# Patient Record
Sex: Male | Born: 1995 | Race: Black or African American | Hispanic: No | Marital: Single | Smoking: Former smoker
Health system: Southern US, Community
[De-identification: ages and names within clinical notes are randomized; demographics above are authoritative.]

## PROBLEM LIST (undated history)

## (undated) DIAGNOSIS — M2290 Unspecified disorder of patella, unspecified knee: Secondary | ICD-10-CM

## (undated) DIAGNOSIS — L309 Dermatitis, unspecified: Secondary | ICD-10-CM

## (undated) DIAGNOSIS — F429 Obsessive-compulsive disorder, unspecified: Secondary | ICD-10-CM

## (undated) HISTORY — PX: WISDOM TOOTH EXTRACTION: SHX21

---
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
## 2014-08-04 ENCOUNTER — Ambulatory Visit (INDEPENDENT_AMBULATORY_CARE_PROVIDER_SITE_OTHER): Payer: Medicaid Other | Admitting: Family Medicine

## 2014-08-04 ENCOUNTER — Encounter: Payer: Self-pay | Admitting: Family Medicine

## 2014-08-04 VITALS — BP 140/88 | HR 90 | Ht 77.0 in | Wt 263.0 lb

## 2014-08-04 DIAGNOSIS — Z0000 Encounter for general adult medical examination without abnormal findings: Secondary | ICD-10-CM

## 2014-08-04 DIAGNOSIS — Z209 Contact with and (suspected) exposure to unspecified communicable disease: Secondary | ICD-10-CM

## 2014-08-04 LAB — LIPID PANEL
Cholesterol: 131 mg/dL (ref 0–200)
HDL: 58 mg/dL (ref >39)
LDL Cholesterol: 65 mg/dL (ref 0–99)
Total CHOL/HDL Ratio: 2.3 Ratio
Triglycerides: 38 mg/dL (ref <150)
VLDL: 8 mg/dL (ref 0–40)

## 2014-08-04 NOTE — Progress Notes (Signed)
   Subjective:    Patient ID: Tanner Figueroa, male    DOB: [birth year], 19 y.o.   MRN: [id number]  HPI He is here for complete examination. He apparently is still in foster care. He [place] [employer]. He is a Medical laboratory scientific officersophomore. He does have concern over deformed right great toe that occurred after a toe fracture. Why she has no particular concerns or complaints. He is sexually active with women and does not always use condoms. He has had a recent test for GC/chlamydia. He does not know his whole family history especially his father. Mother apparently has mental illnesses. Social history was reviewed. He does wear his seatbelt. Does exercise.   Review of Systems  All other systems reviewed and are negative.      Objective:   Physical Exam BP 140/88 mmHg  Pulse 90  Ht 6\' 5"  (1.956 m)  Wt 263 lb (119.296 kg)  BMI 31.18 kg/m2  General Appearance:    Alert, cooperative, no distress, appears stated age  Head:    Normocephalic, without obvious abnormality, atraumatic  Eyes:    PERRL, conjunctiva/corneas clear, EOM's intact, fundi    benign  Ears:    Normal TM's and external ear canals  Nose:   Nares normal, mucosa normal, no drainage or sinus   tenderness  Throat:   Lips, mucosa, and tongue normal; teeth and gums normal  Neck:   Supple, no lymphadenopathy;  thyroid:  no   enlargement/tenderness/nodules; no carotid   bruit or JVD  Back:    Spine nontender, no curvature, ROM normal, no CVA     tenderness  Lungs:     Clear to auscultation bilaterally without wheezes, rales or     ronchi; respirations unlabored  Chest Wall:    No tenderness or deformity   Heart:    Regular rate and rhythm, S1 and S2 normal, no murmur, rub   or gallop  Breast Exam:    No chest wall tenderness, masses or gynecomastia  Abdomen:     Soft, non-tender, nondistended, normoactive bowel sounds,    no masses, no hepatosplenomegaly  Genitalia:    Normal male external genitalia without lesions.   Testicles without masses.  No inguinal hernias.  Rectal:   Deferred due to age <40 and lack of symptoms  Extremities:   No clubbing, cyanosis or edema  Pulses:   2+ and symmetric all extremities  Skin:   Skin color, texture, turgor normal, no rashes or lesions  Lymph nodes:   Cervical, supraclavicular, and axillary nodes normal  Neurologic:   CNII-XII intact, normal strength, sensation and gait; reflexes 2+ and symmetric throughout          Psych:   Normal mood, affect, hygiene and grooming.          Assessment & Plan:  Routine general medical examination at a health care facility - Plan: Lipid panel  Contact with or exposure to communicable disease - Plan: HIV antibody, RPR  cautioned him to use condoms with sexual activity. Encouraged him to continue to wear his seatbelt. Discussed weight and weight management with him. He is right on the border with his BMI. Encouraged him to keep his waist size at 38 or lower.

## 2014-08-05 LAB — RPR

## 2014-08-05 LAB — HIV ANTIBODY (ROUTINE TESTING W REFLEX): HIV 1&2 Ab, 4th Generation: NONREACTIVE

## 2014-08-08 ENCOUNTER — Encounter: Payer: Self-pay | Admitting: Internal Medicine

## 2015-03-20 ENCOUNTER — Telehealth: Payer: Self-pay | Admitting: Family Medicine

## 2015-03-20 NOTE — Telephone Encounter (Signed)
Done pt has an appt this am with ENT

## 2015-03-20 NOTE — Telephone Encounter (Signed)
Kathy has taken care of this. 
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
## 2017-08-14 ENCOUNTER — Ambulatory Visit: Payer: Self-pay | Admitting: Family Medicine

## 2018-06-17 ENCOUNTER — Encounter (HOSPITAL_COMMUNITY): Payer: Self-pay

## 2018-06-17 ENCOUNTER — Emergency Department (HOSPITAL_COMMUNITY)
Admission: EM | Admit: 2018-06-17 | Discharge: 2018-06-17 | Disposition: A | Discharge status: Home / Self Care | Payer: Self-pay | Attending: Emergency Medicine | Admitting: Emergency Medicine

## 2018-06-17 DIAGNOSIS — Z113 Encounter for screening for infections with a predominantly sexual mode of transmission: Secondary | ICD-10-CM

## 2018-06-17 DIAGNOSIS — Z87891 Personal history of nicotine dependence: Secondary | ICD-10-CM | POA: Insufficient documentation

## 2018-06-17 DIAGNOSIS — B009 Herpesviral infection, unspecified: Secondary | ICD-10-CM | POA: Insufficient documentation

## 2018-06-17 LAB — URINALYSIS, ROUTINE W REFLEX MICROSCOPIC
Bilirubin Urine: NEGATIVE
GLUCOSE, UA: NEGATIVE mg/dL
HGB URINE DIPSTICK: NEGATIVE
KETONES UR: NEGATIVE mg/dL
LEUKOCYTES UA: NEGATIVE
Nitrite: NEGATIVE
Protein, ur: NEGATIVE mg/dL
Specific Gravity, Urine: 1.012 (ref 1.005–1.030)
pH: 7 (ref 5.0–8.0)

## 2018-06-17 MED ORDER — LIDOCAINE HCL 1 % IJ SOLN
INTRAMUSCULAR | Status: AC
  Filled 2018-06-17: qty 20

## 2018-06-17 MED ORDER — CEFTRIAXONE SODIUM 250 MG IJ SOLR
250.0000 mg | Freq: Once | INTRAMUSCULAR | Status: AC
  Administered 2018-06-17: 250 mg via INTRAMUSCULAR
  Filled 2018-06-17: qty 250

## 2018-06-17 MED ORDER — VALACYCLOVIR HCL 1 G PO TABS: 1000.0000 mg | ORAL_TABLET | Freq: Two times a day (BID) | ORAL | 0 refills | Status: AC

## 2018-06-17 MED ORDER — AZITHROMYCIN 250 MG PO TABS
1000.0000 mg | ORAL_TABLET | Freq: Once | ORAL | Status: AC
  Administered 2018-06-17: 1000 mg via ORAL
  Filled 2018-06-17: qty 4

## 2018-06-17 NOTE — Discharge Instructions (Addendum)
Evaluated today for STDs.  Your exam is consistent with herpes.  I have prescribed you Valtrex.  Please take as prescribed.  Also prophylactically treated you with Rocephin and azithromycin for gonorrhea and chlamydia.  If your results are positive he will be notified in 48 hours.  You will need to let your partners know at that time to they may be treated as well.  Urinalysis is negative for infection.  Please use condoms for protection while you have a herpes outbreak.  Return to the ED for any new worsening symptoms.

## 2018-06-17 NOTE — ED Notes (Signed)
Pt aware that urine specimen is needed at this time. Pt given water but denies urge to void.

## 2018-06-17 NOTE — ED Provider Notes (Signed)
[facility]-EMERGENCY DEPT Provider Note   CSN: [id number] Arrival date & time: 06/17/18  1604     History   Chief Complaint Chief Complaint  Patient presents with  . std check    HPI Tanner Figueroa is a 22 y.o. male with no significant past medical history who presents for evaluation of STD check.  Patient states he noticed 3 erythematous painful lesions to the shaft of his penis approximately 3 days ago.  Patient states he was seen by [facility], however patient states that they did not do an exam, they just did a swab for gonorrhea, chlamydia, HIV and syphilis.  States they did not offer prophylactic treatment at that time.  She states he is very concerned that he has an STD.  States partner told him that he tested positive for gonorrhea.  Patient would like to be treated with prophylactic treatment during his visit today.  Patient admits to dysuria x1 day.  Denies hematuria, fever, chills, nausea, vomiting, abdominal pain, testicular pain, penile discharge, pain with bowel movements.  Denies history of previous STDs.  Patient states he does not use condoms for protection.  States he does have pain to the lesions on the shaft.  Rates his pain a 5/10.  Denies radiation of pain.  Denies aggravating or alleviating factors.  History obtained from patient.  No interpreter was used.  HPI  Past Medical History:  Diagnosis Date  . Eczema   . OCD (obsessive compulsive disorder)     There are no active problems to display for this patient.   Past Surgical History:  Procedure Laterality Date  . WISDOM TOOTH EXTRACTION          Home Medications    Prior to Admission medications   Medication Sig Start Date End Date Taking? Authorizing Provider  valACYclovir (VALTREX) 1000 MG tablet Take 1 tablet (1,000 mg total) by mouth 2 (two) times daily for 10 days. 06/17/18 06/27/18  Arneda Sappington A, PA-C    Family History Family History  Problem Relation Age  of Onset  . Mental illness Mother     Social History Social History   Tobacco Use  . Smoking status: Former Smoker    Types: Cigarettes    Last attempt to quit: 07/04/2014    Years since quitting: 3.9  Substance Use Topics  . Alcohol use: No    Alcohol/week: 0.0 standard drinks  . Drug use: Yes    Types: Marijuana     Allergies   Penicillins   Review of Systems Review of Systems  Constitutional: Negative.   Gastrointestinal: Negative.   Genitourinary: Positive for dysuria and genital sores. Negative for decreased urine volume, difficulty urinating, discharge, enuresis, flank pain, frequency, hematuria, penile pain, penile swelling, scrotal swelling, testicular pain and urgency.  Musculoskeletal: Negative.   Skin:       Lesion  Neurological: Negative.   All other systems reviewed and are negative.    Physical Exam Updated Vital Signs BP 120/78 (BP Location: Left Arm)   Pulse 78   Temp 99 F (37.2 C) (Oral)   Resp 15   SpO2 100%   Physical Exam  Constitutional: He appears well-developed and well-nourished. No distress.  HENT:  Head: Atraumatic.  Mouth/Throat: Oropharynx is clear and moist.  Eyes: Pupils are equal, round, and reactive to light.  Neck: Normal range of motion. Neck supple.  Cardiovascular: Normal rate, regular rhythm, normal heart sounds and intact distal pulses. Exam reveals no gallop and  no friction rub.  No murmur heard. Pulmonary/Chest: Effort normal and breath sounds normal. No stridor. No respiratory distress. He has no wheezes. He has no rales. He exhibits no tenderness.  Abdominal: Soft. Bowel sounds are normal. He exhibits no distension and no mass. There is no tenderness. There is no rebound and no guarding. No hernia.  Genitourinary: Testes normal. Cremasteric reflex is present. Right testis shows no mass, no swelling and no tenderness. Left testis shows no mass, no swelling and no tenderness. Circumcised. No phimosis, paraphimosis,  hypospadias, penile erythema or penile tenderness. No discharge found.  Genitourinary Comments: GU examination with chaperone in room.  Penis  without  evidence of discharge.  3 vesicular lesions on erythematous base to shaft of penis.  Testes without tenderness to palpation.  There are no masses, swelling, no tenderness to epididymis.   Musculoskeletal: Normal range of motion.  Neurological: He is alert.  Skin: Skin is warm and dry. Lesion noted. He is not diaphoretic.  3 vesicular lesions on erythematous base to shaft of penis.  Psychiatric: He has a normal mood and affect.  Nursing note and vitals reviewed.    ED Treatments / Results  Labs (all labs ordered are listed, but only abnormal results are displayed) Labs Reviewed  URINALYSIS, ROUTINE W REFLEX MICROSCOPIC  GC/CHLAMYDIA PROBE AMP ([place]) NOT AT [facility]    EKG None  Radiology No results found.  Procedures Procedures (including critical care time)  Medications Ordered in ED Medications  lidocaine (XYLOCAINE) 1 % (with pres) injection (has no administration in time range)  cefTRIAXone (ROCEPHIN) injection 250 mg (250 mg Intramuscular Given 06/17/18 1853)  azithromycin (ZITHROMAX) tablet 1,000 mg (1,000 mg Oral Given 06/17/18 1852)     Initial Impression / Assessment and Plan / ED Course  I have reviewed the triage vital signs and the nursing notes.  Pertinent labs & imaging results that were available during my care of the patient were reviewed by me and considered in my medical decision making (see chart for details).  22 year old male who appears otherwise well presents for evaluation of STD check.  Patient denies history of STDs.  Patient states [facility] was tested for HIV, syphilis, gonorrhea and chlamydia.  Patient states he was not offer prophylactic medicine at that time.  Patient states his partner did tell him that they were positive for gonorrhea.  Patient states 2 days ago  he noticed 3 vesicular lesions on the shaft of his penis.  Exam consistent with herpes.  Will DC home with Valtrex.  Discussed safe sex practices.  Denies history of genital herpes. No penile discharge, no scrotal tenderness.  Patient is requesting rescreening of gonorrhea and chlamydia at this time.  Will obtain urinalysis.  Patient requesting prophylactic treatment with azithromycin and Rocephin at this time.  Patient does not want rescreening for HIV and syphilis.  Patient is afebrile without abdominal tenderness, abdominal pain or painful bowel movements to indicate prostatitis.  No tenderness to palpation of the testes or epididymis to suggest orchitis or epididymitis.  Urinalysis negative for infection.  STD cultures obtained including  gonorrhea and chlamydia. Patient to be discharged with instructions to follow up with PCP. Discussed importance of using protection when sexually active. Pt understands that they have GC/Chlamydia cultures pending and that they will need to inform all sexual partners if results return positive. Patient has been treated prophylactically with azithromycin and Rocephin.  Patient is hemodynamically stable and appropriate for DC home  at this time.  Discussed return precautions.  Patient voiced understanding and is agreeable for follow-up.    Final Clinical Impressions(s) / ED Diagnoses   Final diagnoses:  Screen for STD (sexually transmitted disease)  Herpes    ED Discharge Orders         Ordered    valACYclovir (VALTREX) 1000 MG tablet  2 times daily     06/17/18 1729           Ariahna Smiddy A, PA-C 06/17/18 1919    Benjiman Core, MD 06/17/18 2325

## 2018-06-17 NOTE — ED Triage Notes (Addendum)
Patient here for STD check and herpes Patient c/o blisters/bumps on penis shaft and painful urination. Patient c/o of swollen glands all started 2 days ago. 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   A/[facility] Ambulatory.

## 2019-11-06 ENCOUNTER — Emergency Department (HOSPITAL_COMMUNITY): Payer: Self-pay

## 2019-11-06 ENCOUNTER — Encounter (HOSPITAL_COMMUNITY): Payer: Self-pay | Admitting: Emergency Medicine

## 2019-11-06 ENCOUNTER — Emergency Department (HOSPITAL_COMMUNITY)
Admission: EM | Admit: 2019-11-06 | Discharge: 2019-11-06 | Disposition: A | Discharge status: Home / Self Care | Payer: Self-pay | Attending: Emergency Medicine | Admitting: Emergency Medicine

## 2019-11-06 DIAGNOSIS — R310 Gross hematuria: Secondary | ICD-10-CM | POA: Insufficient documentation

## 2019-11-06 DIAGNOSIS — R748 Abnormal levels of other serum enzymes: Secondary | ICD-10-CM | POA: Insufficient documentation

## 2019-11-06 DIAGNOSIS — N2889 Other specified disorders of kidney and ureter: Secondary | ICD-10-CM | POA: Insufficient documentation

## 2019-11-06 DIAGNOSIS — F17210 Nicotine dependence, cigarettes, uncomplicated: Secondary | ICD-10-CM | POA: Insufficient documentation

## 2019-11-06 LAB — URINALYSIS, ROUTINE W REFLEX MICROSCOPIC
Bacteria, UA: NONE SEEN
Bilirubin Urine: NEGATIVE
Glucose, UA: NEGATIVE mg/dL
Ketones, ur: NEGATIVE mg/dL
Leukocytes,Ua: NEGATIVE
Nitrite: NEGATIVE
Protein, ur: NEGATIVE mg/dL
RBC / HPF: >50 RBC/hpf — ABNORMAL HIGH (ref 0–5)
Specific Gravity, Urine: 1.023 (ref 1.005–1.030)
pH: 7 (ref 5.0–8.0)

## 2019-11-06 LAB — COMPREHENSIVE METABOLIC PANEL
ALT: 22 U/L (ref 0–44)
AST: 38 U/L (ref 15–41)
Albumin: 4.3 g/dL (ref 3.5–5.0)
Alkaline Phosphatase: 45 U/L (ref 38–126)
Anion gap: 10 (ref 5–15)
BUN: 10 mg/dL (ref 6–20)
CO2: 24 mmol/L (ref 22–32)
Calcium: 9.5 mg/dL (ref 8.9–10.3)
Chloride: 105 mmol/L (ref 98–111)
Creatinine, Ser: 1.08 mg/dL (ref 0.61–1.24)
GFR calc Af Amer: >60 mL/min (ref >60)
GFR calc non Af Amer: >60 mL/min (ref >60)
Glucose, Bld: 82 mg/dL (ref 70–99)
Potassium: 4.3 mmol/L (ref 3.5–5.1)
Sodium: 139 mmol/L (ref 135–145)
Total Bilirubin: 0.6 mg/dL (ref 0.3–1.2)
Total Protein: 6.8 g/dL (ref 6.5–8.1)

## 2019-11-06 LAB — CBC WITH DIFFERENTIAL/PLATELET
Abs Immature Granulocytes: 0.05 10*3/uL (ref 0.00–0.07)
Basophils Absolute: 0 10*3/uL (ref 0.0–0.1)
Basophils Relative: 0 %
Eosinophils Absolute: 0.1 10*3/uL (ref 0.0–0.5)
Eosinophils Relative: 1 %
HCT: 39.2 % (ref 39.0–52.0)
Hemoglobin: 13.1 g/dL (ref 13.0–17.0)
Immature Granulocytes: 0 %
Lymphocytes Relative: 17 %
Lymphs Abs: 2.2 10*3/uL (ref 0.7–4.0)
MCH: 32.3 pg (ref 26.0–34.0)
MCHC: 33.4 g/dL (ref 30.0–36.0)
MCV: 96.6 fL (ref 80.0–100.0)
Monocytes Absolute: 0.9 10*3/uL (ref 0.1–1.0)
Monocytes Relative: 7 %
Neutro Abs: 9.9 10*3/uL — ABNORMAL HIGH (ref 1.7–7.7)
Neutrophils Relative %: 75 %
Platelets: 154 10*3/uL (ref 150–400)
RBC: 4.06 MIL/uL — ABNORMAL LOW (ref 4.22–5.81)
RDW: 13.7 % (ref 11.5–15.5)
WBC: 13.1 10*3/uL — ABNORMAL HIGH (ref 4.0–10.5)
nRBC: 0 % (ref 0.0–0.2)

## 2019-11-06 LAB — CK: Total CK: 1194 U/L — ABNORMAL HIGH (ref 49–397)

## 2019-11-06 MED ORDER — KETOROLAC TROMETHAMINE 30 MG/ML IJ SOLN
30.0000 mg | Freq: Once | INTRAMUSCULAR | Status: AC
  Administered 2019-11-06: 16:00:00 30 mg via INTRAVENOUS
  Filled 2019-11-06: qty 1

## 2019-11-06 MED ORDER — SODIUM CHLORIDE 0.9 % IV BOLUS
1000.0000 mL | Freq: Once | INTRAVENOUS | Status: AC
  Administered 2019-11-06: 17:00:00 1000 mL via INTRAVENOUS

## 2019-11-06 MED ORDER — SODIUM CHLORIDE 0.9 % IV BOLUS
1000.0000 mL | Freq: Once | INTRAVENOUS | Status: AC
  Administered 2019-11-06: 1000 mL via INTRAVENOUS

## 2019-11-06 NOTE — Discharge Instructions (Signed)
Stay hydrated.  Your muscle enzyme slightly elevated and you just need to drink a lot of water.  Take Tylenol for pain.  On your ultrasound, you may have a cyst or mass in the left kidney.  Please see urology for follow-up.  Return to ER if you have worse flank pain or abdominal pain or fever or more blood in your urine or trouble urinating.

## 2019-11-06 NOTE — ED Provider Notes (Signed)
[facility] EMERGENCY DEPARTMENT Provider Note   CSN: [id number] Arrival date & time: 11/06/19  1420     History Chief Complaint  Patient presents with  . Abdominal Pain  . Hematuria    Tanner Figueroa is a 24 y.o. male history of eczema and OCD here presenting with right flank pain, hematuria.  Patient states that he has acute onset of hematuria.  Also has some urgency as well. He also is complaining of right flank pain.  He states that he was told that he may have a kidney stone so was concerned and came for evaluation.  Denies any fevers or chills or vomiting.   The history is provided by the patient.       Past Medical History:  Diagnosis Date  . Eczema   . OCD (obsessive compulsive disorder)     There are no problems to display for this patient.   Past Surgical History:  Procedure Laterality Date  . WISDOM TOOTH EXTRACTION         Family History  Problem Relation Age of Onset  . Mental illness Mother     Social History   Tobacco Use  . Smoking status: Current Every Day Smoker    Types: Cigarettes    Last attempt to quit: 07/04/2014    Years since quitting: 5.3  . Smokeless tobacco: Never Used  Substance Use Topics  . Alcohol use: No    Alcohol/week: 0.0 standard drinks  . Drug use: Yes    Types: Marijuana    Home Medications Prior to Admission medications   Not on File    Allergies    Penicillins  Review of Systems   Review of Systems  Gastrointestinal: Positive for abdominal pain.  Genitourinary: Positive for hematuria.  All other systems reviewed and are negative.   Physical Exam Updated Vital Signs BP 126/84 (BP Location: Right Arm)   Pulse 89   Temp 98.1 F (36.7 C) (Oral)   Resp 14   Ht 6\' 6"  (1.981 m)   Wt 113.4 kg   SpO2 96%   BMI 28.89 kg/m   Physical Exam Vitals and nursing note reviewed.  Constitutional:      Appearance: He is well-developed.  HENT:     Head: Normocephalic.     Mouth/Throat:       Mouth: Mucous membranes are moist.  Eyes:     Extraocular Movements: Extraocular movements intact.  Cardiovascular:     Rate and Rhythm: Normal rate and regular rhythm.     Heart sounds: Normal heart sounds.  Pulmonary:     Effort: Pulmonary effort is normal.     Breath sounds: Normal breath sounds.  Abdominal:     General: Abdomen is flat.     Comments: Mild R CVAT   Genitourinary:    Comments: No penile lesions or discharge. No testicular tenderness  Skin:    General: Skin is warm.     Capillary Refill: Capillary refill takes less than 2 seconds.  Neurological:     General: No focal deficit present.     Mental Status: He is alert and oriented to person, place, and time.  Psychiatric:        Mood and Affect: Mood normal.        Behavior: Behavior normal.     ED Results / Procedures / Treatments   Labs (all labs ordered are listed, but only abnormal results are displayed) Labs Reviewed  URINALYSIS, ROUTINE W REFLEX MICROSCOPIC -  Abnormal; Notable for the following components:      Result Value   APPearance HAZY (*)    Hgb urine dipstick LARGE (*)    RBC / HPF >50 (*)    All other components within normal limits  CBC WITH DIFFERENTIAL/PLATELET - Abnormal; Notable for the following components:   WBC 13.1 (*)    RBC 4.06 (*)    Neutro Abs 9.9 (*)    All other components within normal limits  CK - Abnormal; Notable for the following components:   Total CK 1,194 (*)    All other components within normal limits  URINE CULTURE  COMPREHENSIVE METABOLIC PANEL    EKG None  Radiology US Renal  Result Date: 11/06/2019 CLINICAL DATA:  Right flank pain EXAM: RENAL / URINARY TRACT ULTRASOUND COMPLETE COMPARISON:  None FINDINGS: Right Kidney: Renal measurements: 11.3 x 6.3 x 6.6 cm = volume: 249 mL. No sign of hydronephrosis or nephrolithiasis on the right. No focal lesion. Left Kidney: Renal measurements: 11.8 x 6.6 x 6.0 cm = volume: 244 mL. No hydronephrosis. Question of  prominent column versus is central renal mass 2.7 x 2.4 x 3.0 cm Bladder: Appears normal for degree of bladder distention. Other: None. IMPRESSION: 1. No hydronephrosis. 2. No visible calculi. 3. Prominent parenchymal column versus renal mass on the left. Follow-up renal protocol CT may be helpful. Electronically Signed   By: Zetta Bills M.D.   On: 11/06/2019 18:15    Procedures Procedures (including critical care time)  Medications Ordered in ED Medications  sodium chloride 0.9 % bolus 1,000 mL (0 mLs Intravenous Stopped 11/06/19 1725)  ketorolac (TORADOL) 30 MG/ML injection 30 mg (30 mg Intravenous Given 11/06/19 1545)  sodium chloride 0.9 % bolus 1,000 mL (0 mLs Intravenous Stopped 11/06/19 1906)    ED Course  I have reviewed the triage vital signs and the nursing notes.  Pertinent labs & imaging results that were available during my care of the patient were reviewed by me and considered in my medical decision making (see chart for details).    MDM Rules/Calculators/A&P                      Tanner Figueroa is a 24 y.o. male here presenting with hematuria, right flank pain.  Consider renal cyst versus renal mass versus renal colic.  Will get labs and urinalysis and renal ultrasound.  Also consider rhabdo given the hematuria as well.  We will get a CK level.  7:20 PM His CK level is 1000.  His UA showed UTI with no infection.  His renal ultrasound showed no Hydro but there may be a left renal mass versus parenchymal column.  Given 2 L normal saline bolus.  We will have him follow-up with urology outpatient.  Told him to stay hydrated.  Final Clinical Impression(s) / ED Diagnoses Final diagnoses:  None    Rx / DC Orders ED Discharge Orders    None       Drenda Freeze, MD 11/06/19 1921

## 2019-11-06 NOTE — ED Triage Notes (Signed)
Pt. Stated, I started having blood in urine and stomachpain this morning around 0500

## 2019-11-07 LAB — URINE CULTURE: Culture: NO GROWTH

## 2020-05-17 IMAGING — US US RENAL
1 series · 14 of 25 positions shown · non-contrast
Comparison: None

CLINICAL DATA: Right flank pain

EXAM:
RENAL / URINARY TRACT ULTRASOUND COMPLETE

[Series 1: us renal · 14 of 26 slices shown]
[im 1/26]
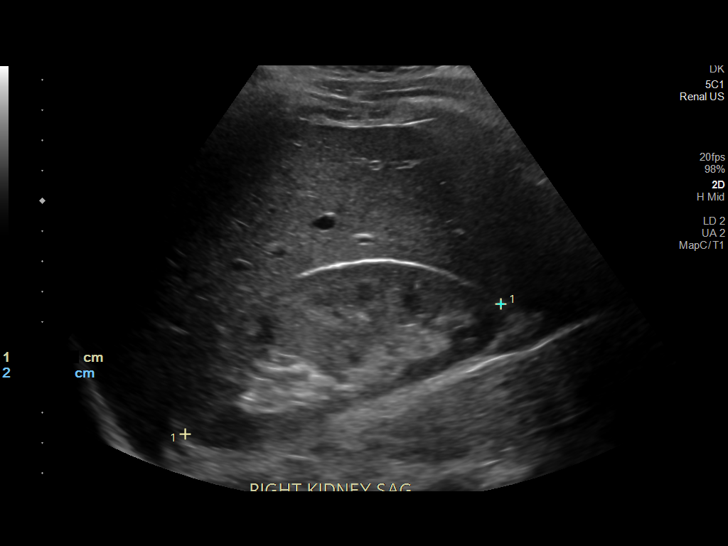
[im 3/26]
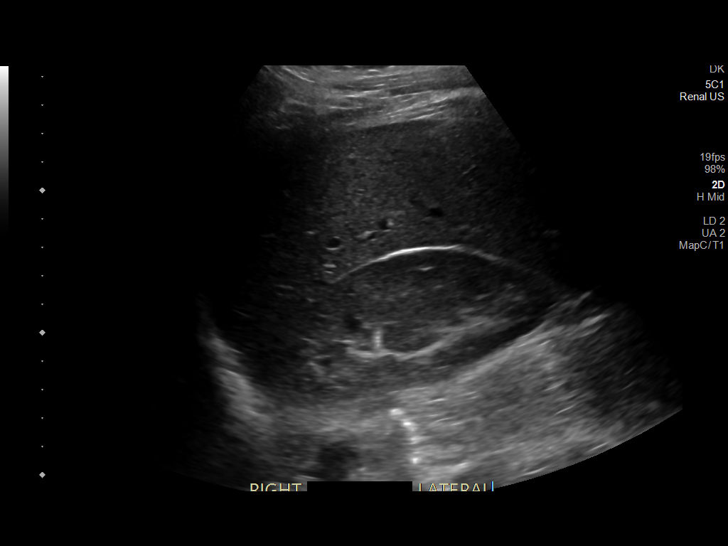
[im 5/26]
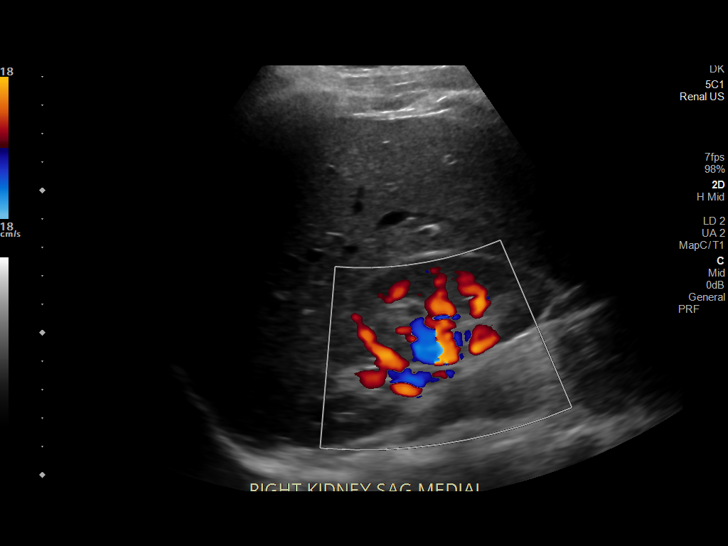
[im 7/26]
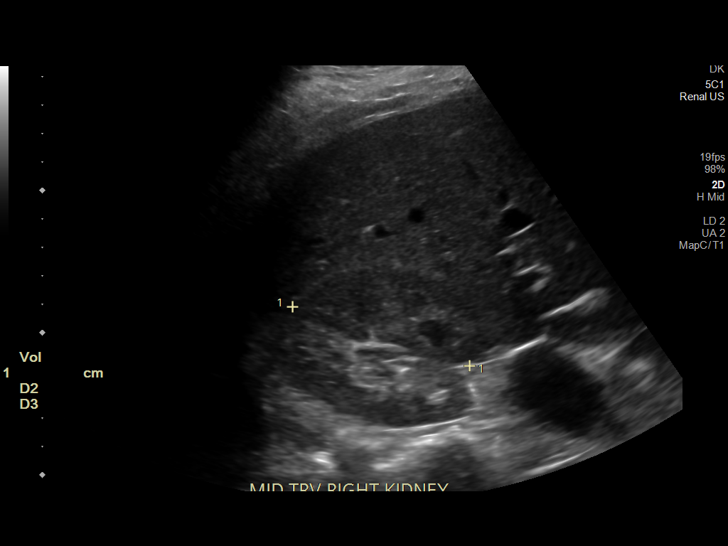
[im 9/26]
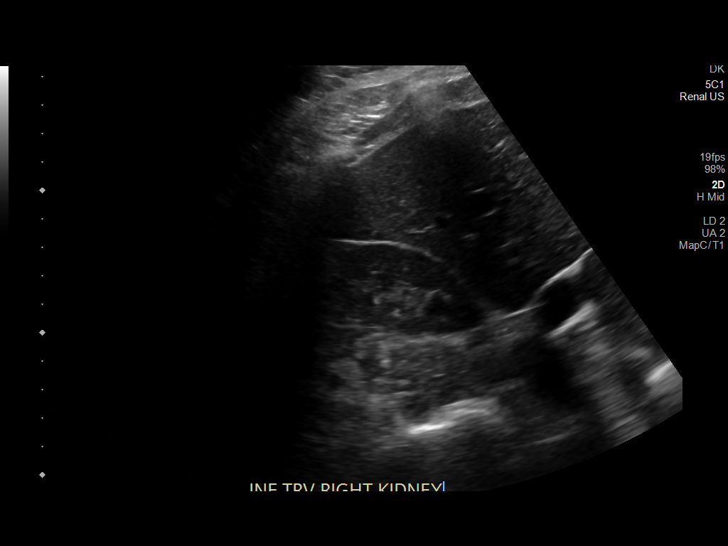
[im 10/26]
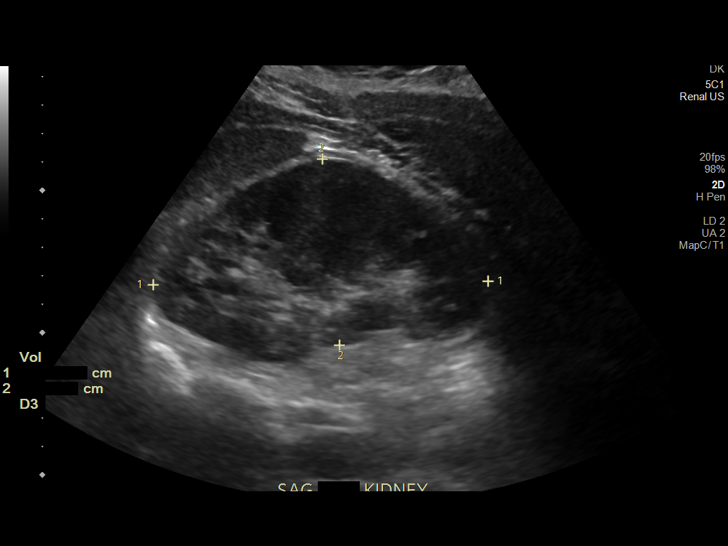
[im 12/26]
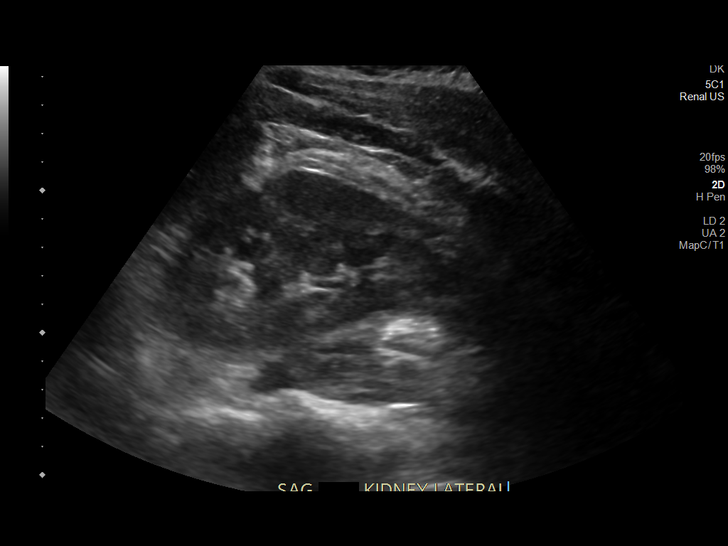
[im 14/26]
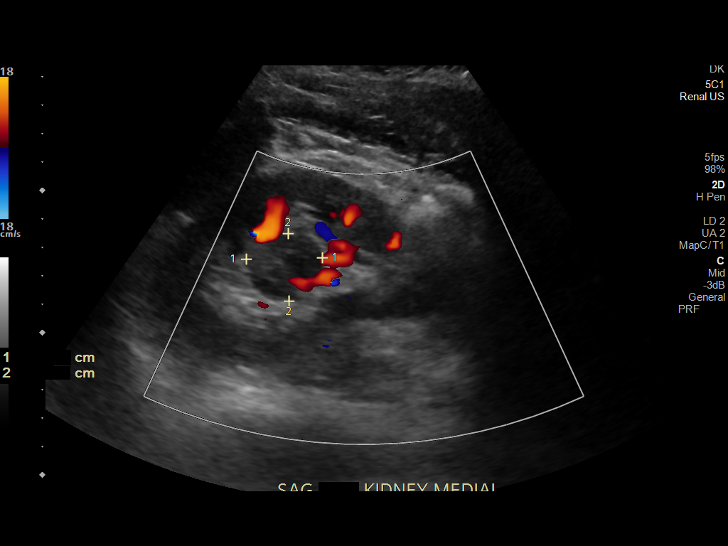
[im 16/26]
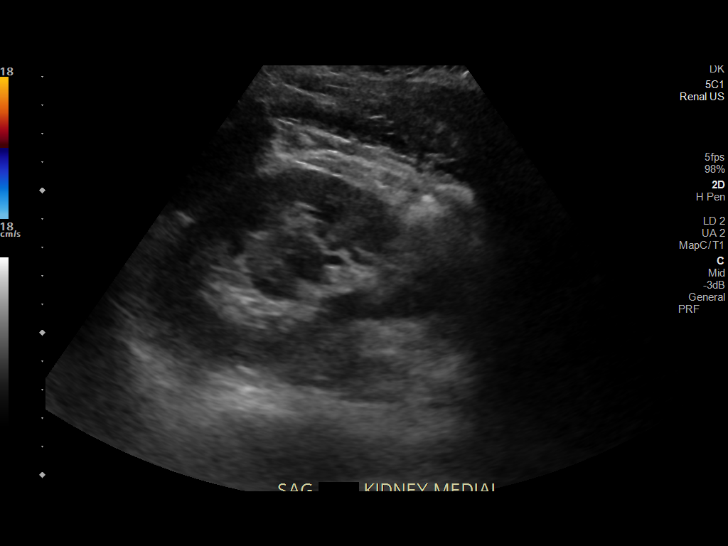
[im 17/26]
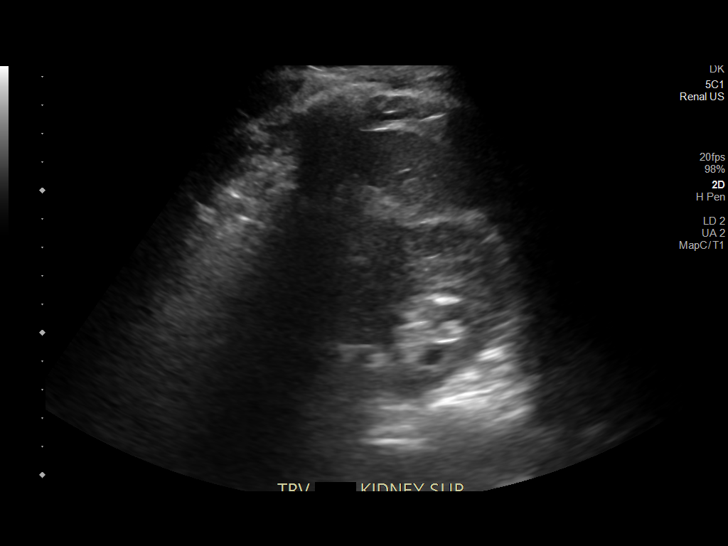
[im 19/26]
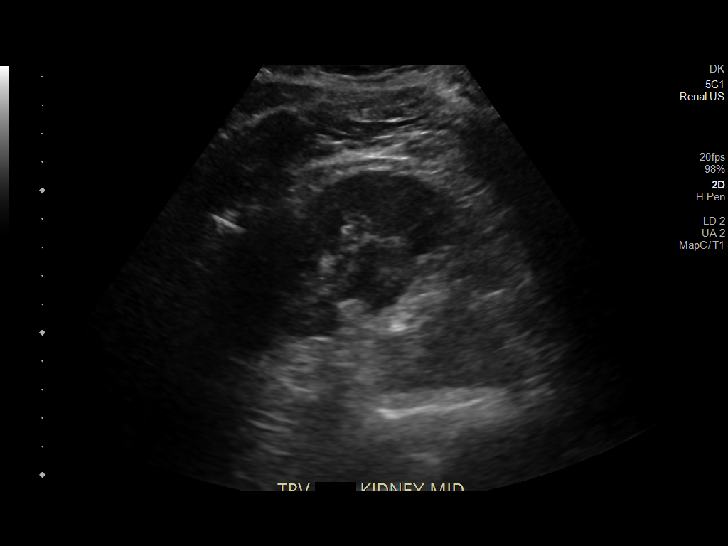
[im 21/26]
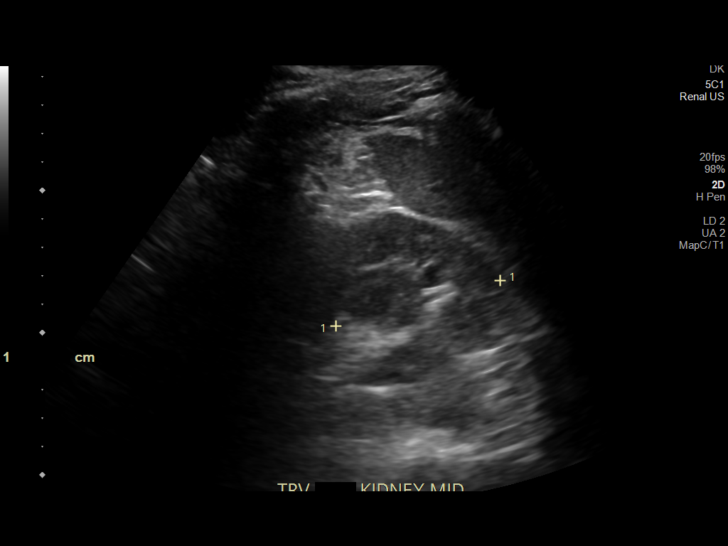
[im 23/26]
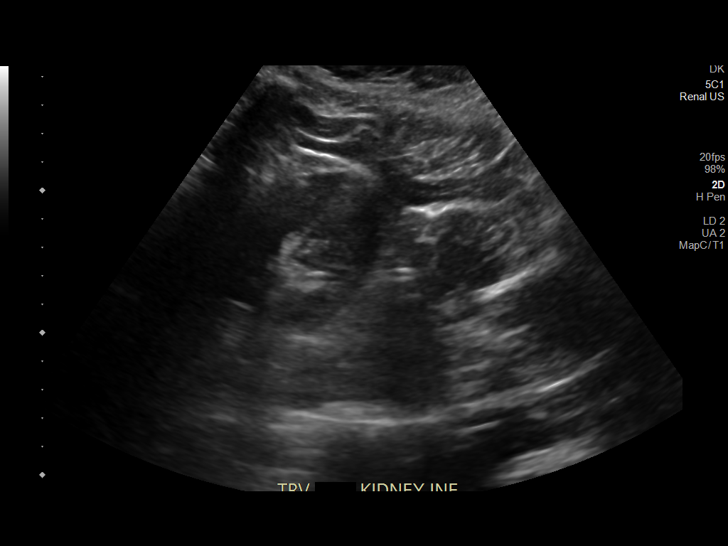
[im 26/26]
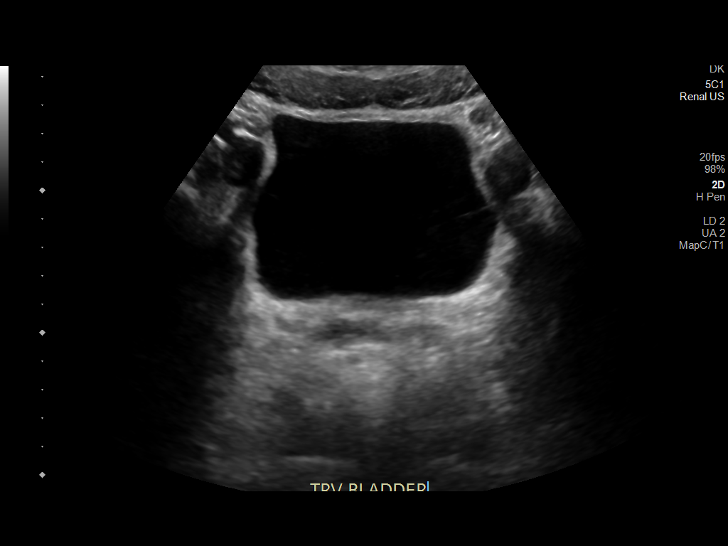

[14 of 25 positions shown; findings below may reference images not displayed]

FINDINGS: Right Kidney:

Renal measurements: 11.3 x 6.3 x 6.6 cm = volume: 249 mL. No sign of
hydronephrosis or nephrolithiasis on the right. No focal lesion.

Left Kidney:

Renal measurements: 11.8 x 6.6 x 6.0 cm = volume: 244 mL. No
hydronephrosis. Question of prominent column versus is central renal
mass 2.7 x 2.4 x 3.0 cm

Bladder:

Appears normal for degree of bladder distention.

Other:

None.
IMPRESSION: 1. No hydronephrosis.
2. No visible calculi.
3. Prominent parenchymal column versus renal mass on the left.
Follow-up renal protocol CT may be helpful.

## 2021-10-21 ENCOUNTER — Encounter (HOSPITAL_COMMUNITY): Payer: Self-pay | Admitting: Emergency Medicine

## 2021-10-21 ENCOUNTER — Emergency Department (HOSPITAL_COMMUNITY)
Admission: EM | Admit: 2021-10-21 | Discharge: 2021-10-21 | Discharge status: Against Medical Advice | Payer: Medicaid Other | Attending: Emergency Medicine | Admitting: Emergency Medicine

## 2021-10-21 ENCOUNTER — Other Ambulatory Visit: Payer: Self-pay

## 2021-10-21 DIAGNOSIS — M65349 Trigger finger, unspecified ring finger: Secondary | ICD-10-CM | POA: Insufficient documentation

## 2021-10-21 DIAGNOSIS — Z5321 Procedure and treatment not carried out due to patient leaving prior to being seen by health care provider: Secondary | ICD-10-CM | POA: Diagnosis not present

## 2021-10-21 NOTE — ED Provider Notes (Addendum)
?  Physical Exam  ?BP (!) 151/87   Pulse 78   Temp 98.2 ?F (36.8 ?C) (Oral)   Resp 16   SpO2 100%  ? ?Physical Exam ? ?Procedures  ?Procedures ? ?ED Course / MDM  ?  ?Medical Decision Making ? ?26 year old presents ED for evaluation of ring being stuck on finger.  When I went to go and see this patient, he was no longer in his room.  Nursing staff had alerted me that the patient had had his ring cut off of the finger and was in no apparent pain.  Patient has eloped. ? ? ? ? ?  ? ? ?  ?Al Decant, PA-C ?10/21/21 1407 ? ?  ?Lorre Nick, MD ?10/21/21 1507 ? ?

## 2021-10-21 NOTE — ED Notes (Signed)
Able to use ring cutter with success. Pt verbalized comfort and denies pain at this time ?

## 2021-10-21 NOTE — ED Triage Notes (Signed)
Pt has a ring stuck on R 4th digit since yesterday.  Denies pain. ?

## 2021-10-21 NOTE — ED Notes (Signed)
Pt left before d/c paperwork and v/s could be obtained ?

## 2022-03-07 ENCOUNTER — Encounter (HOSPITAL_BASED_OUTPATIENT_CLINIC_OR_DEPARTMENT_OTHER): Payer: Self-pay | Admitting: Emergency Medicine

## 2022-03-07 ENCOUNTER — Emergency Department (HOSPITAL_BASED_OUTPATIENT_CLINIC_OR_DEPARTMENT_OTHER): Payer: Medicaid Other

## 2022-03-07 ENCOUNTER — Other Ambulatory Visit: Payer: Self-pay

## 2022-03-07 DIAGNOSIS — M25461 Effusion, right knee: Secondary | ICD-10-CM | POA: Insufficient documentation

## 2022-03-07 DIAGNOSIS — Y92410 Unspecified street and highway as the place of occurrence of the external cause: Secondary | ICD-10-CM | POA: Diagnosis not present

## 2022-03-07 DIAGNOSIS — M25561 Pain in right knee: Secondary | ICD-10-CM | POA: Diagnosis not present
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
## 2022-03-08 ENCOUNTER — Emergency Department (HOSPITAL_BASED_OUTPATIENT_CLINIC_OR_DEPARTMENT_OTHER)
Admission: EM | Admit: 2022-03-08 | Discharge: 2022-03-08 | Disposition: A | Discharge status: Home / Self Care | Payer: Medicaid Other | Attending: Emergency Medicine | Admitting: Emergency Medicine

## 2022-03-08 DIAGNOSIS — M25469 Effusion, unspecified knee: Secondary | ICD-10-CM

## 2022-03-08 MED ORDER — IBUPROFEN 800 MG PO TABS
800.0000 mg | ORAL_TABLET | Freq: Once | ORAL | Status: AC
  Administered 2022-03-08: 800 mg via ORAL
  Filled 2022-03-08: qty 1

## 2022-03-08 MED ORDER — IBUPROFEN 800 MG PO TABS: 800.0000 mg | ORAL_TABLET | Freq: Three times a day (TID) | ORAL | 0 refills | Status: DC
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
## 2022-03-12 ENCOUNTER — Ambulatory Visit (INDEPENDENT_AMBULATORY_CARE_PROVIDER_SITE_OTHER): Payer: Medicaid Other | Admitting: Orthopaedic Surgery

## 2022-03-12 ENCOUNTER — Encounter: Payer: Self-pay | Admitting: Orthopaedic Surgery

## 2022-03-12 DIAGNOSIS — M25561 Pain in right knee: Secondary | ICD-10-CM

## 2022-03-12 DIAGNOSIS — G8929 Other chronic pain: Secondary | ICD-10-CM
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
## 2022-03-23 ENCOUNTER — Other Ambulatory Visit: Payer: Medicaid Other

## 2022-03-25 ENCOUNTER — Ambulatory Visit
Admission: RE | Admit: 2022-03-25 | Discharge: 2022-03-25 | Disposition: A | Discharge status: Home / Self Care | Payer: Medicaid Other | Source: Ambulatory Visit | Attending: Orthopaedic Surgery | Admitting: Orthopaedic Surgery

## 2022-03-25 DIAGNOSIS — G8929 Other chronic pain: Secondary | ICD-10-CM

## 2022-03-26 NOTE — Progress Notes (Signed)
Tried to call. "Call cannot be completed at this time"

## 2022-03-26 NOTE — Progress Notes (Signed)
Needs appt for MRI.  thanks

## 2022-03-26 NOTE — Progress Notes (Signed)
Call cannot be completed. Will try again.

## 2022-04-04 ENCOUNTER — Encounter: Payer: Self-pay | Admitting: Orthopaedic Surgery

## 2022-04-04 ENCOUNTER — Ambulatory Visit (INDEPENDENT_AMBULATORY_CARE_PROVIDER_SITE_OTHER): Payer: Medicaid Other | Admitting: Orthopaedic Surgery

## 2022-04-04 DIAGNOSIS — M25561 Pain in right knee: Secondary | ICD-10-CM

## 2022-04-04 DIAGNOSIS — G8929 Other chronic pain: Secondary | ICD-10-CM | POA: Diagnosis not present

## 2022-04-04 NOTE — Progress Notes (Signed)
Office Visit Note   Patient: Tanner Figueroa           Date of Birth: May 17, 1996           MRN: [id number] Visit Date: 04/04/2022              Requested by: No referring provider defined for this encounter. PCP: Tanner Figueroa   Assessment & Plan: Visit Diagnoses:  1. Chronic pain of right knee     Plan: MRI scan reviewed with Tanner Figueroa.  Shows a loose body posterior to the posterior horn of the lateral meniscus.  Difficult to identify donor site.  High-grade partial tear of the MPFL with lateral subluxation of the patella and increased TT-TG distance of 18 mm.  PCL is ruptured and ACL is intact.  Grade 1 sprains to the posterior lateral corner.  These findings were reviewed with patient in detail and treatment recommendation is for physical therapy for the PCL and the posterior lateral corner sprain.  We will have him regain his range of motion first.  For the MPFL and the lateral patella subluxation I have recommended MPFL repair versus reconstruction and lateral release.  Risk benefits prognosis of the surgery reviewed with the patient.  I have placed a referral for physical therapy for 4 weeks.  PSO brace was provided today.  He will take it easy at work.  I have asked him to bring his wife to the next appointment so that we can talk more in depth about the surgery.  Total face to face encounter time was greater than 25 minutes and over half of this time was spent in counseling and/or coordination of care.  Follow-Up Instructions: Return in about 4 weeks (around 05/02/2022).   Orders:  No orders of the defined types were placed in this encounter.  No orders of the defined types were placed in this encounter.     Procedures: No procedures performed   Clinical Data: No additional findings.   Subjective: Chief Complaint  Patient presents with   Right Knee - Follow-up    MRI review    HPI Patient returns today to discuss right knee MRI scan.  Continues to feel  like the knee Wants to see about in the knee give way.  Review of Systems  Constitutional: Negative.   All other systems reviewed and are negative.    Objective: Vital Signs: There were no vitals taken for this visit.  Physical Exam Vitals and nursing note reviewed.  Constitutional:      Appearance: He is well-developed.  HENT:     Head: Normocephalic and atraumatic.  Eyes:     Pupils: Pupils are equal, round, and reactive to light.  Pulmonary:     Effort: Pulmonary effort is normal.  Abdominal:     Palpations: Abdomen is soft.  Musculoskeletal:        General: Normal range of motion.     Cervical back: Neck supple.  Skin:    General: Skin is warm.  Neurological:     Mental Status: He is alert and oriented to person, place, and time.  Psychiatric:        Behavior: Behavior normal.        Thought Content: Thought content normal.        Judgment: Judgment normal.     Ortho Exam Examination of right knee shows moderate joint effusion.  There is no joint line tenderness.  Positive patellar apprehension and tenderness to the medial  border of the patella.  ACL, MCL, LCL feel stable.  1+ posterior drawer.  2 mm of posterior sag.  Dial test is normal at 30 degrees and 90 degrees of flexion.  Specialty Comments:  No specialty comments available.  Imaging: No results found.   PMFS History: There are no problems to display for this patient.  Past Medical History:  Diagnosis Date   Eczema    OCD (obsessive compulsive disorder)     Family History  Problem Relation Age of Onset   Mental illness Mother     Past Surgical History:  Procedure Laterality Date   WISDOM TOOTH EXTRACTION     Social History   Occupational History   Not on file  Tobacco Use   Smoking status: Former    Types: Cigarettes    Quit date: 07/04/2014    Years since quitting: 7.7   Smokeless tobacco: Never  Vaping Use   Vaping Use: Every day  Substance and Sexual Activity   Alcohol use: Yes    Drug use: Yes    Types: Marijuana   Sexual activity: Yes
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
## 2022-04-11 ENCOUNTER — Encounter: Payer: Self-pay | Admitting: Physical Therapy

## 2022-04-11 ENCOUNTER — Ambulatory Visit: Payer: Medicaid Other | Attending: Orthopaedic Surgery | Admitting: Physical Therapy

## 2022-04-11 ENCOUNTER — Telehealth: Payer: Self-pay | Admitting: Orthopaedic Surgery

## 2022-04-11 ENCOUNTER — Other Ambulatory Visit: Payer: Self-pay

## 2022-04-11 DIAGNOSIS — G8929 Other chronic pain: Secondary | ICD-10-CM | POA: Diagnosis not present

## 2022-04-11 DIAGNOSIS — M6281 Muscle weakness (generalized): Secondary | ICD-10-CM | POA: Insufficient documentation

## 2022-04-11 DIAGNOSIS — M25561 Pain in right knee: Secondary | ICD-10-CM | POA: Insufficient documentation

## 2022-04-11 DIAGNOSIS — R2689 Other abnormalities of gait and mobility: Secondary | ICD-10-CM | POA: Insufficient documentation

## 2022-04-11 DIAGNOSIS — R600 Localized edema: Secondary | ICD-10-CM | POA: Diagnosis present

## 2022-04-11 NOTE — Telephone Encounter (Signed)
Patient called advised the brace he has is to small. Patient asked if he can come in an pick up a larger brace? The number to contact patient is [phone] or [phone]

## 2022-04-11 NOTE — Patient Instructions (Signed)
Access Code: [email or web address] URL: [email or web address] Date: 04/11/2022 Prepared by: Rosana Hoes  Exercises - Supine Heel Slide with Strap  - 1 x daily - 10 reps - 5 seconds hold - Long Sitting Calf Stretch with Strap  - 1 x daily - 7 x weekly - 3 sets - 10 reps - Active Straight Leg Raise with Quad Set  - 1 x daily - 3 sets - 10 reps - Sidelying Hip Abduction  - 1 x daily - 3 sets - 10 reps - Prone Hip Extension  - 1 x daily - 3 sets - 10 reps

## 2022-04-17 NOTE — Therapy (Signed)
OUTPATIENT PHYSICAL THERAPY TREATMENT NOTE   Patient Name: Tanner Figueroa MRN: [id number] DOB:02/03/1996, 26 y.o., male Today's Date: 04/19/2022  PCP: NA   REFERRING PROVIDER: Leandrew Koyanagi, MD   END OF SESSION:   PT End of Session - 04/19/22 0932     Visit Number 2    Number of Visits 5    Date for PT Re-Evaluation 05/09/22    Authorization Type MCD    Authorization Time Period pending    PT Start Time 0928    PT Stop Time 1000    PT Time Calculation (min) 32 min    Activity Tolerance Patient tolerated treatment well    Behavior During Therapy WFL for tasks assessed/performed             Past Medical History:  Diagnosis Date   Eczema    OCD (obsessive compulsive disorder)    Past Surgical History:  Procedure Laterality Date   WISDOM TOOTH EXTRACTION     There are no problems to display for this patient.   REFERRING DIAG: Chronic pain of right knee  THERAPY DIAG:  Acute pain of right knee  Muscle weakness (generalized)  Other abnormalities of gait and mobility  Localized edema  Rationale for Evaluation and Treatment Rehabilitation  PERTINENT HISTORY: Loose body posterior to the posterior horn of the lateral meniscus, high-grade partial tear of the MPFL with lateral subluxation of the patella, PCL is ruptured, grade 1 sprains to the posterior lateral corner  PRECAUTIONS: Knee  SUBJECTIVE: Patient reports he may have stretched his knee a little to much this morning. He has been doing alright a work. No new issues.  PAIN:  Are you having pain? Yes:  NPRS scale: 2/10 (5-6/10 pain with activity) Pain location: Right knee Pain description: Tight, pulling, sharp, aching Aggravating factors: Bending the knee, squatting, twisting the knee Relieving factors: Rest, using the brace  PATIENT GOALS: Improve knee motion and strength in preparation for surgery   OBJECTIVE: (objective measures completed at initial evaluation unless otherwise dated) PATIENT  SURVEYS:  LEFS 30/80   EDEMA:  Not formally assessed, right knee with visible edema compared to left   MUSCLE LENGTH: Quad, hamstring, calf flexibility deficit   PALPATION: Tender to palpation medial parapatellar region   LOWER EXTREMITY ROM:   Active ROM Right eval Left eval Right 04/19/2022  Knee flexion 110 135 118  Knee extension 0 0     LOWER EXTREMITY MMT:   MMT Right eval Left eval  Hip flexion 4 4  Hip extension 4 4  Hip abduction 4 4  Knee flexion - 5  Knee extension 4 5    GAIT: Assistive device utilized: None Level of assistance: Complete Independence Comments: Antalgic on right     TODAY'S TREATMENT: [name] Adult PT Treatment:                                                DATE: 04/19/2022 Therapeutic Exercise: SLR 2 x 10 Sidelying hip abduction 2 x 10 Prone hip extension x 10 Squat to table 0-60 deg 2 x 10 Standing TKE with green 2 x 10 Leg press (cybex) 0-60 deg DL: 120# 2 x 10 SL: 40# 2 x 10 Wall squat 0-60 deg 2 x 10 with 10 sec hold Standing heel raises 2 x 20   [name] Adult PT Treatment:  DATE: 04/11/2022 Therapeutic Exercise: Supine passive heel slide knee flexion stretch 5 x 5 sec Longsitting calf stretch x 30 sec SLR x 10 Sidelying hip abduction x 10 Prone hip extension x 10   PATIENT EDUCATION:  Education details: HEP update Person educated: Patient Education method: Explanation, Demonstration, Tactile cues, Verbal cues, and Handouts Education comprehension: verbalized understanding, returned demonstration, verbal cues required, tactile cues required, and needs further education   HOME EXERCISE PROGRAM: Access Code: [email or web address]     ASSESSMENT: CLINICAL IMPRESSION: Patient tolerated therapy well with no adverse effects. Therapy limited on time due to patient arriving late. Patient does exhibit improved knee flexion this visit. Therapy focuses on progressing knee strength and control with  good tolerance. Progressed to closed chain exercises in limited range of knee flexion. Updated HEP to progress strengthening. Patient would benefit from continued skilled PT to progress his mobility and strength in order to reduce pain and maximize functional ability.     OBJECTIVE IMPAIRMENTS Abnormal gait, decreased activity tolerance, decreased balance, decreased ROM, decreased strength, impaired flexibility, and pain.    ACTIVITY LIMITATIONS bending, sitting, standing, squatting, stairs, and locomotion level   PARTICIPATION LIMITATIONS: meal prep, cleaning, laundry, shopping, community activity, occupation, and yard work   PERSONAL FACTORS Past/current experiences, Profession, and Time since onset of injury/illness/exacerbation are also affecting patient's functional outcome.      GOALS: Goals reviewed with patient? Yes   LONG TERM GOALS: Target date: 05/09/2022 (LTG= STG)   Patient will be I with final HEP to maintain progress from PT. Baseline: HEP provided at eval Goal status: INITIAL   2.  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  Patient will demonstrate right extension strength 5/5 MMT in order to avoid knee giving way with walking or squatting tasks Baseline: 4/5 MMT Goal status: INITIAL   4.  Patient will report pain level </= 3/10 with standing and walking to improve ability to perform work related tasks Baseline: 5-6/10 pain level Goal status: INITIAL     PLAN: PT FREQUENCY: 1x/week   PT DURATION: 4 weeks   PLANNED INTERVENTIONS: Therapeutic exercises, Therapeutic activity, Neuromuscular re-education, Balance training, Gait training, Patient/Family education, Self Care, Joint mobilization, Joint manipulation, Aquatic Therapy, Dry Needling, Electrical stimulation, Cryotherapy, Moist heat, Taping, Vasopneumatic device,  Manual therapy, and Re-evaluation   PLAN FOR NEXT SESSION: Reviw HEP and progress PRN, knee flexion PROM, quad and hip strengthening, gait training    Hilda Blades, PT, DPT, LAT, ATC 04/19/22  10:00 AM Phone: [phone] Fax: [phone]

## 2022-04-19 ENCOUNTER — Encounter: Payer: Self-pay | Admitting: Physical Therapy

## 2022-04-19 ENCOUNTER — Ambulatory Visit: Payer: Medicaid Other | Admitting: Physical Therapy

## 2022-04-19 ENCOUNTER — Other Ambulatory Visit: Payer: Self-pay

## 2022-04-19 DIAGNOSIS — M25561 Pain in right knee: Secondary | ICD-10-CM | POA: Diagnosis not present

## 2022-04-19 DIAGNOSIS — M6281 Muscle weakness (generalized): Secondary | ICD-10-CM

## 2022-04-19 DIAGNOSIS — R600 Localized edema: Secondary | ICD-10-CM

## 2022-04-19 DIAGNOSIS — R2689 Other abnormalities of gait and mobility: Secondary | ICD-10-CM

## 2022-04-19 NOTE — Patient Instructions (Signed)
Access Code: [email or web address] URL: [email or web address] Date: 04/19/2022 Prepared by: Hilda Blades  Exercises - Supine Heel Slide with Strap  - 1 x daily - 10 reps - 5 seconds hold - Long Sitting Calf Stretch with Strap  - 1 x daily - 7 x weekly - 3 sets - 10 reps - Active Straight Leg Raise with Quad Set  - 1 x daily - 3 sets - 10 reps - Sidelying Hip Abduction  - 1 x daily - 3 sets - 10 reps - Prone Hip Extension  - 1 x daily - 3 sets - 10 reps - Wall Quarter Squat  - 1 x daily - 2 sets - 10 reps - 10 seconds hold - Standing Heel Raise  - 1 x daily - 3 sets - 20 reps

## 2022-04-25 NOTE — Therapy (Signed)
OUTPATIENT PHYSICAL THERAPY TREATMENT NOTE   Patient Name: Tanner Figueroa MRN: [id number] DOB:1995-11-12, 26 y.o., male Today's Date: 04/26/2022  PCP: NA   REFERRING PROVIDER: Leandrew Koyanagi, MD   END OF SESSION:   PT End of Session - 04/26/22 1017     Visit Number 3    Number of Visits 5    Date for PT Re-Evaluation 05/09/22    Authorization Type MCD    Authorization Time Period pending    PT Start Time 1015    PT Stop Time 1045    PT Time Calculation (min) 30 min    Activity Tolerance Patient tolerated treatment well    Behavior During Therapy WFL for tasks assessed/performed              Past Medical History:  Diagnosis Date   Eczema    OCD (obsessive compulsive disorder)    Past Surgical History:  Procedure Laterality Date   WISDOM TOOTH EXTRACTION     There are no problems to display for this patient.   REFERRING DIAG: Chronic pain of right knee  THERAPY DIAG:  Acute pain of right knee  Muscle weakness (generalized)  Other abnormalities of gait and mobility  Localized edema  Rationale for Evaluation and Treatment Rehabilitation  PERTINENT HISTORY: Loose body posterior to the posterior horn of the lateral meniscus, high-grade partial tear of the MPFL with lateral subluxation of the patella, PCL is ruptured, grade 1 sprains to the posterior lateral corner  PRECAUTIONS: Knee   SUBJECTIVE: Patient reports he is doing well.   PAIN:  Are you having pain? Yes:  NPRS scale: 0/10 (5-6/10 pain with activity) Pain location: Right knee Pain description: Tight, pulling, sharp, aching Aggravating factors: Bending the knee, squatting, twisting the knee Relieving factors: Rest, using the brace  PATIENT GOALS: Improve knee motion and strength in preparation for surgery   OBJECTIVE: (objective measures completed at initial evaluation unless otherwise dated) PATIENT SURVEYS:  LEFS 30/80   EDEMA:  Not formally assessed, right knee with visible edema  compared to left   MUSCLE LENGTH: Quad, hamstring, calf flexibility deficit   PALPATION: Tender to palpation medial parapatellar region   LOWER EXTREMITY ROM:   Active ROM Right eval Left eval Right 04/19/2022  Knee flexion 110 135 118  Knee extension 0 0     LOWER EXTREMITY MMT:   MMT Right eval Left eval  Hip flexion 4 4  Hip extension 4 4  Hip abduction 4 4  Knee flexion - 5  Knee extension 4 5    GAIT: Assistive device utilized: None Level of assistance: Complete Independence Comments: Antalgic on right     TODAY'S TREATMENT: [name] Adult PT Treatment:                                                DATE: 04/25/2022 Therapeutic Exercise: Recumbent bike L3 x 4 min Wall squat 0-60 deg 2 x 10 with 10 sec hold Standing heel raises 2 x 20 Leg press (cybex) 0-60 deg SL: 60# 2 x 10 Standing TKE with black power band 2 x 20   [name] Adult PT Treatment:  DATE: 04/19/2022 Therapeutic Exercise: SLR 2 x 10 Sidelying hip abduction 2 x 10 Prone hip extension x 10 Squat to table 0-60 deg 2 x 10 Standing TKE with green 2 x 10 Leg press (cybex) 0-60 deg DL: 120# 2 x 10 SL: 40# 2 x 10 Wall squat 0-60 deg 2 x 10 with 10 sec hold Standing heel raises 2 x 20  [name] Adult PT Treatment:                                                DATE: 04/11/2022 Therapeutic Exercise: Supine passive heel slide knee flexion stretch 5 x 5 sec Longsitting calf stretch x 30 sec SLR x 10 Sidelying hip abduction x 10 Prone hip extension x 10   PATIENT EDUCATION:  Education details: HEP Person educated: Patient Education method: Consulting civil engineer, Demonstration, Corporate treasurer cues, Verbal cues Education comprehension: verbalized understanding, returned demonstration, verbal cues required, tactile cues required, and needs further education   HOME EXERCISE PROGRAM: Access Code: [email or web address]     ASSESSMENT: CLINICAL IMPRESSION: Patient tolerated therapy well  with no adverse effects. Therapy limited due to patient arriving late. Therapy continues to focus on progressing knee mobility and strengthening with good tolerance. He seems to be progressing well and did not report any increase in pain this visit. No changes to HEP this visit. Patient would benefit from continued skilled PT to progress his mobility and strength in order to reduce pain and maximize functional ability.     OBJECTIVE IMPAIRMENTS Abnormal gait, decreased activity tolerance, decreased balance, decreased ROM, decreased strength, impaired flexibility, and pain.    ACTIVITY LIMITATIONS bending, sitting, standing, squatting, stairs, and locomotion level   PARTICIPATION LIMITATIONS: meal prep, cleaning, laundry, shopping, community activity, occupation, and yard work   PERSONAL FACTORS Past/current experiences, Profession, and Time since onset of injury/illness/exacerbation are also affecting patient's functional outcome.      GOALS: Goals reviewed with patient? Yes   LONG TERM GOALS: Target date: 05/09/2022 (LTG= STG)   Patient will be I with final HEP to maintain progress from PT. Baseline: HEP provided at eval Goal status: INITIAL   2.  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  Patient will demonstrate right extension strength 5/5 MMT in order to avoid knee giving way with walking or squatting tasks Baseline: 4/5 MMT Goal status: INITIAL   4.  Patient will report pain level </= 3/10 with standing and walking to improve ability to perform work related tasks Baseline: 5-6/10 pain level Goal status: INITIAL     PLAN: PT FREQUENCY: 1x/week   PT DURATION: 4 weeks   PLANNED INTERVENTIONS: Therapeutic exercises, Therapeutic activity, Neuromuscular re-education, Balance training, Gait training, Patient/Family education, Self  Care, Joint mobilization, Joint manipulation, Aquatic Therapy, Dry Needling, Electrical stimulation, Cryotherapy, Moist heat, Taping, Vasopneumatic device, Manual therapy, and Re-evaluation   PLAN FOR NEXT SESSION: Reviw HEP and progress PRN, knee flexion PROM, quad and hip strengthening, gait training    Hilda Blades, PT, DPT, LAT, ATC 04/26/22  10:45 AM Phone: [phone] Fax: [phone]

## 2022-04-26 ENCOUNTER — Encounter: Payer: Self-pay | Admitting: Physical Therapy

## 2022-04-26 ENCOUNTER — Other Ambulatory Visit: Payer: Self-pay

## 2022-04-26 ENCOUNTER — Ambulatory Visit: Payer: Medicaid Other | Admitting: Physical Therapy

## 2022-04-26 DIAGNOSIS — M25561 Pain in right knee: Secondary | ICD-10-CM | POA: Diagnosis not present

## 2022-04-26 DIAGNOSIS — M6281 Muscle weakness (generalized): Secondary | ICD-10-CM

## 2022-04-26 DIAGNOSIS — R600 Localized edema: Secondary | ICD-10-CM

## 2022-04-26 DIAGNOSIS — R2689 Other abnormalities of gait and mobility: Secondary | ICD-10-CM

## 2022-05-02 NOTE — Therapy (Signed)
OUTPATIENT PHYSICAL THERAPY TREATMENT NOTE  DISCHARGE   Patient Name: Tanner Figueroa MRN: [id number] DOB:09-30-1995, 26 y.o., male Today's Date: 05/03/2022  PCP: NA   REFERRING PROVIDER: Leandrew Koyanagi, MD   END OF SESSION:   PT End of Session - 05/03/22 1008     Visit Number 4    Number of Visits 5    Date for PT Re-Evaluation 05/09/22    Authorization Type MCD    Authorization Time Period 04/19/2022 - 05/03/2022    Authorization - Visit Number 3    Authorization - Number of Visits 3    PT Start Time 1000    PT Stop Time 1040    PT Time Calculation (min) 40 min    Activity Tolerance Patient tolerated treatment well    Behavior During Therapy WFL for tasks assessed/performed               Past Medical History:  Diagnosis Date   Eczema    OCD (obsessive compulsive disorder)    Past Surgical History:  Procedure Laterality Date   WISDOM TOOTH EXTRACTION     There are no problems to display for this patient.   REFERRING DIAG: Chronic pain of right knee  THERAPY DIAG:  Acute pain of right knee  Muscle weakness (generalized)  Other abnormalities of gait and mobility  Localized edema  Rationale for Evaluation and Treatment Rehabilitation  PERTINENT HISTORY: Loose body posterior to the posterior horn of the lateral meniscus, high-grade partial tear of the MPFL with lateral subluxation of the patella, PCL is ruptured, grade 1 sprains to the posterior lateral corner  PRECAUTIONS: Knee   SUBJECTIVE: Patient reports he is doing well. He follows-up with the surgeon next week to schedule knee surgery.   PAIN:  Are you having pain? Yes:  NPRS scale: 0/10 (5-6/10 pain with activity) Pain location: Right knee Pain description: Tight, pulling, sharp, aching Aggravating factors: Bending the knee, squatting, twisting the knee Relieving factors: Rest, using the brace  PATIENT GOALS: Improve knee motion and strength in preparation for surgery   OBJECTIVE:  (objective measures completed at initial evaluation unless otherwise dated) PATIENT SURVEYS:  LEFS 30/80   EDEMA:  Not formally assessed, right knee with visible edema compared to left   MUSCLE LENGTH: Quad, hamstring, calf flexibility deficit   PALPATION: Tender to palpation medial parapatellar region   LOWER EXTREMITY ROM:   Active ROM Right eval Left eval Right 04/19/2022 Right 05/03/2022  Knee flexion 110 135 118 120  Knee extension 0 0      LOWER EXTREMITY MMT:   MMT Right eval Left eval Right 05/03/2022  Hip flexion 4 4   Hip extension 4 4   Hip abduction 4 4   Knee flexion - 5   Knee extension _0 GAIT: Assistive device utilized: None Level of assistance: Complete Independence Comments: Antalgic on right     TODAY'S TREATMENT: [name] Adult PT Treatment:                                                DATE: 05/03/2022 Therapeutic Exercise: Recumbent bike L3 x 5 min SLR 2 x 20 Bridge 2 x 10 Sidelying hip abduction 2 x 20 Leg press (cybex) 0-60 deg SL: 60# 3 x 10 Standing TKE with black power band 2 x 20 Wall  squat 0-60 deg 2 x 10 with 10 sec hold Standing heel raises 2 x 20   [name] Adult PT Treatment:                                                DATE: 04/25/2022 Therapeutic Exercise: Recumbent bike L3 x 4 min Wall squat 0-60 deg 2 x 10 with 10 sec hold Standing heel raises 2 x 20 Leg press (cybex) 0-60 deg SL: 60# 2 x 10 Standing TKE with black power band 2 x 20  [name] Adult PT Treatment:                                                DATE: 04/19/2022 Therapeutic Exercise: SLR 2 x 10 Sidelying hip abduction 2 x 10 Prone hip extension x 10 Squat to table 0-60 deg 2 x 10 Standing TKE with green 2 x 10 Leg press (cybex) 0-60 deg DL: 120# 2 x 10 SL: 40# 2 x 10 Wall squat 0-60 deg 2 x 10 with 10 sec hold Standing heel raises 2 x 20   PATIENT EDUCATION:  Education details: POC discharge, follow-up with PT after surgery, post-op precautions and  expected progress after surgery, HEP Person educated: Patient Education method: Consulting civil engineer, Demonstration Education comprehension: verbalized understanding, returned demonstration   HOME EXERCISE PROGRAM: Access Code: [email or web address]     ASSESSMENT: CLINICAL IMPRESSION: Patient tolerated therapy well with no adverse effects. Therapy continued to focus on progressing knee strength and control with good tolerance. He follows-up with the surgeon next week and will be scheduled for surgery so patient will discharge from therapy today and will follow-up with PT following his surgery. Patient will be discharged from PT.     OBJECTIVE IMPAIRMENTS Abnormal gait, decreased activity tolerance, decreased balance, decreased ROM, decreased strength, impaired flexibility, and pain.    ACTIVITY LIMITATIONS bending, sitting, standing, squatting, stairs, and locomotion level   PARTICIPATION LIMITATIONS: meal prep, cleaning, laundry, shopping, community activity, occupation, and yard work   PERSONAL FACTORS Past/current experiences, Profession, and Time since onset of injury/illness/exacerbation are also affecting patient's functional outcome.      GOALS: Goals reviewed with patient? Yes   LONG TERM GOALS: Target date: 05/09/2022 (LTG= STG)   Patient will be I with final HEP to maintain progress from PT. Baseline: HEP provided at eval 05/03/2022: independent Goal status: MET   2.  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  Patient will demonstrate right extension strength 5/5 MMT in order to avoid knee giving way with walking or squatting tasks Baseline: 4/5 MMT 05/03/2022: 5/5 MMT Goal status: MET   4.  Patient will report pain level </= 3/10 with standing and walking to improve ability to perform work related tasks Baseline: 5-6/10 pain  level 05/03/2022: denies pain Goal status: MET     PLAN: PT FREQUENCY: 1x/week   PT DURATION: 4 weeks   PLANNED INTERVENTIONS: Therapeutic exercises, Therapeutic activity, Neuromuscular re-education, Balance training, Gait training, Patient/Family education, Self Care, Joint mobilization, Joint manipulation, Aquatic Therapy, Dry Needling, Electrical stimulation, Cryotherapy, Moist heat,  Taping, Vasopneumatic device, Manual therapy, and Re-evaluation   PLAN FOR NEXT SESSION: NA - discharge    Hilda Blades, PT, DPT, LAT, ATC 05/03/22  10:49 AM Phone: [phone] Fax: [phone]  PHYSICAL THERAPY DISCHARGE SUMMARY  Visits from Start of Care: 4  Current functional level related to goals / functional outcomes: See above   Remaining deficits: See above   Education / Equipment: HEP   Patient agrees to discharge. Patient goals were met. Patient is being discharged due to meeting the stated rehab goals.

## 2022-05-03 ENCOUNTER — Other Ambulatory Visit: Payer: Self-pay

## 2022-05-03 ENCOUNTER — Ambulatory Visit: Payer: Medicaid Other | Attending: Orthopaedic Surgery | Admitting: Physical Therapy

## 2022-05-03 ENCOUNTER — Encounter: Payer: Self-pay | Admitting: Physical Therapy

## 2022-05-03 DIAGNOSIS — M6281 Muscle weakness (generalized): Secondary | ICD-10-CM | POA: Insufficient documentation

## 2022-05-03 DIAGNOSIS — M25561 Pain in right knee: Secondary | ICD-10-CM | POA: Diagnosis present

## 2022-05-03 DIAGNOSIS — R2689 Other abnormalities of gait and mobility: Secondary | ICD-10-CM | POA: Diagnosis present

## 2022-05-03 DIAGNOSIS — R600 Localized edema: Secondary | ICD-10-CM | POA: Diagnosis present

## 2022-05-09 ENCOUNTER — Ambulatory Visit (INDEPENDENT_AMBULATORY_CARE_PROVIDER_SITE_OTHER): Payer: Medicaid Other | Admitting: Orthopaedic Surgery

## 2022-05-09 DIAGNOSIS — M25861 Other specified joint disorders, right knee: Secondary | ICD-10-CM

## 2022-05-09 DIAGNOSIS — S76111A Strain of right quadriceps muscle, fascia and tendon, initial encounter: Secondary | ICD-10-CM

## 2022-05-09 DIAGNOSIS — M2341 Loose body in knee, right knee: Secondary | ICD-10-CM | POA: Diagnosis not present

## 2022-05-09 NOTE — Progress Notes (Signed)
Office Visit Note   Patient: Tanner Figueroa           Date of Birth: [birth year]           MRN: [id number] Visit Date: 05/09/2022              Requested by: No referring provider defined for this encounter. PCP: Patient, No Pcp Per   Assessment & Plan: Visit Diagnoses:  1. Traumatic avulsion of patellofemoral ligament, right, initial encounter   2. Patellofemoral dysfunction of right knee   3. Chondral loose body of right knee joint     Plan: Again we had a long discussion today about his findings and he has not benefited from physical therapy therefore I recommended knee arthroscopy with lateral release and MPFL reconstruction.  I will also attempt to remove the loose body that is behind the lateral meniscus.  Details of the surgery including risk benefits prognosis reviewed.  Based on his upcoming schedule we will plan to do this sometime in December after he gets back from vacation from [place]  Anticipated time out of work was also addressed.  Total face to face encounter time was greater than 25 minutes and over half of this time was spent in counseling and/or coordination of care.  Follow-Up Instructions: No follow-ups on file.   Orders:  No orders of the defined types were placed in this encounter.  No orders of the defined types were placed in this encounter.     Procedures: No procedures performed   Clinical Data: No additional findings.   Subjective: Chief Complaint  Patient presents with   Right Knee - Pain    HPI Patient returns today with his wife to discuss further about surgery.  We last saw him about a month ago.  He is has experienced another episode of patellar instability.  He is still wearing the PSO brace.  He is back to work.  The knee is continues to feel like it gives way.  Review of Systems  Constitutional: Negative.   All other systems reviewed and are negative.    Objective: Vital Signs: There were no vitals taken for this  visit.  Physical Exam Vitals and nursing note reviewed.  Constitutional:      Appearance: He is well-developed.  HENT:     Head: Normocephalic and atraumatic.  Eyes:     Pupils: Pupils are equal, round, and reactive to light.  Pulmonary:     Effort: Pulmonary effort is normal.  Abdominal:     Palpations: Abdomen is soft.  Musculoskeletal:        General: Normal range of motion.     Cervical back: Neck supple.  Skin:    General: Skin is warm.  Neurological:     Mental Status: He is alert and oriented to person, place, and time.  Psychiatric:        Behavior: Behavior normal.        Thought Content: Thought content normal.        Judgment: Judgment normal.     Ortho Exam Examination of the right knee is unchanged.  Positive patellar apprehension.  Tenderness to the medial retinaculum.  Lateral tracking patella.  Increased Q angle.  Collaterals and cruciates are stable. Specialty Comments:  No specialty comments available.  Imaging: No results found.   PMFS History: Patient Active Problem List   Diagnosis Date Noted   Traumatic avulsion of patellofemoral ligament, right, initial encounter 05/09/2022   Patellofemoral dysfunction of  right knee 05/09/2022   Chondral loose body of right knee joint 05/09/2022   Past Medical History:  Diagnosis Date   Eczema    OCD (obsessive compulsive disorder)     Family History  Problem Relation Age of Onset   Mental illness Mother     Past Surgical History:  Procedure Laterality Date   WISDOM TOOTH EXTRACTION     Social History   Occupational History   Not on file  Tobacco Use   Smoking status: Former    Types: Cigarettes    Quit date: 07/04/2014    Years since quitting: 7.8   Smokeless tobacco: Never  Vaping Use   Vaping Use: Every day  Substance and Sexual Activity   Alcohol use: Yes   Drug use: Yes    Types: Marijuana   Sexual activity: Yes

## 2022-06-28 ENCOUNTER — Other Ambulatory Visit: Payer: Self-pay

## 2022-07-03 ENCOUNTER — Encounter (HOSPITAL_BASED_OUTPATIENT_CLINIC_OR_DEPARTMENT_OTHER): Payer: Self-pay | Admitting: Orthopaedic Surgery

## 2022-07-03 ENCOUNTER — Other Ambulatory Visit: Payer: Self-pay

## 2022-07-08 NOTE — Progress Notes (Signed)

## 2022-07-10 ENCOUNTER — Encounter: Payer: Self-pay | Admitting: Orthopaedic Surgery

## 2022-07-10 ENCOUNTER — Other Ambulatory Visit: Payer: Self-pay

## 2022-07-10 ENCOUNTER — Ambulatory Visit (HOSPITAL_BASED_OUTPATIENT_CLINIC_OR_DEPARTMENT_OTHER): Payer: Medicaid Other | Admitting: Certified Registered"

## 2022-07-10 ENCOUNTER — Encounter (HOSPITAL_BASED_OUTPATIENT_CLINIC_OR_DEPARTMENT_OTHER): Payer: Self-pay | Admitting: Orthopaedic Surgery

## 2022-07-10 ENCOUNTER — Ambulatory Visit (HOSPITAL_COMMUNITY): Payer: Medicaid Other

## 2022-07-10 ENCOUNTER — Encounter (HOSPITAL_BASED_OUTPATIENT_CLINIC_OR_DEPARTMENT_OTHER)
Admission: RE | Disposition: A | Discharge status: Home / Self Care | Payer: Self-pay | Source: Ambulatory Visit | Attending: Orthopaedic Surgery

## 2022-07-10 ENCOUNTER — Ambulatory Visit (HOSPITAL_BASED_OUTPATIENT_CLINIC_OR_DEPARTMENT_OTHER)
Admission: RE | Admit: 2022-07-10 | Discharge: 2022-07-10 | Disposition: A | Discharge status: Home / Self Care | Payer: Medicaid Other | Source: Ambulatory Visit | Attending: Orthopaedic Surgery | Admitting: Orthopaedic Surgery

## 2022-07-10 DIAGNOSIS — M25861 Other specified joint disorders, right knee: Secondary | ICD-10-CM | POA: Diagnosis not present

## 2022-07-10 DIAGNOSIS — S83411D Sprain of medial collateral ligament of right knee, subsequent encounter: Secondary | ICD-10-CM

## 2022-07-10 DIAGNOSIS — S76111A Strain of right quadriceps muscle, fascia and tendon, initial encounter: Secondary | ICD-10-CM | POA: Diagnosis not present

## 2022-07-10 DIAGNOSIS — F419 Anxiety disorder, unspecified: Secondary | ICD-10-CM | POA: Insufficient documentation

## 2022-07-10 DIAGNOSIS — S83281A Other tear of lateral meniscus, current injury, right knee, initial encounter: Secondary | ICD-10-CM | POA: Diagnosis not present

## 2022-07-10 DIAGNOSIS — X58XXXA Exposure to other specified factors, initial encounter: Secondary | ICD-10-CM | POA: Insufficient documentation

## 2022-07-10 DIAGNOSIS — S83281D Other tear of lateral meniscus, current injury, right knee, subsequent encounter: Secondary | ICD-10-CM | POA: Diagnosis not present

## 2022-07-10 DIAGNOSIS — F429 Obsessive-compulsive disorder, unspecified: Secondary | ICD-10-CM | POA: Insufficient documentation

## 2022-07-10 DIAGNOSIS — Z87891 Personal history of nicotine dependence: Secondary | ICD-10-CM | POA: Insufficient documentation

## 2022-07-10 DIAGNOSIS — M2241 Chondromalacia patellae, right knee: Secondary | ICD-10-CM | POA: Diagnosis not present

## 2022-07-10 DIAGNOSIS — M222X1 Patellofemoral disorders, right knee: Secondary | ICD-10-CM

## 2022-07-10 DIAGNOSIS — M2341 Loose body in knee, right knee: Secondary | ICD-10-CM | POA: Diagnosis present

## 2022-07-10 HISTORY — DX: Unspecified disorder of patella, unspecified knee: M22.90

## 2022-07-10 HISTORY — PX: FOREIGN BODY REMOVAL: SHX962

## 2022-07-10 HISTORY — PX: KNEE ARTHROSCOPY WITH MEDIAL PATELLAR FEMORAL LIGAMENT RECONSTRUCTION: SHX5652

## 2022-07-10 HISTORY — PX: KNEE ARTHROSCOPY WITH LATERAL RELEASE: SHX5649

## 2022-07-10 SURGERY — REPAIR, TENDON, PATELLAR, ARTHROSCOPIC
Anesthesia: General | Site: Knee | Laterality: Right

## 2022-07-10 MED ORDER — HYDROMORPHONE HCL 1 MG/ML IJ SOLN
INTRAMUSCULAR | Status: AC
  Filled 2022-07-10: qty 0.5

## 2022-07-10 MED ORDER — DEXAMETHASONE SODIUM PHOSPHATE 10 MG/ML IJ SOLN
INTRAMUSCULAR | Status: DC | PRN
  Administered 2022-07-10: 4 mg via INTRAVENOUS

## 2022-07-10 MED ORDER — ONDANSETRON HCL 4 MG/2ML IJ SOLN
INTRAMUSCULAR | Status: DC | PRN
  Administered 2022-07-10: 4 mg via INTRAVENOUS

## 2022-07-10 MED ORDER — ROPIVACAINE HCL 5 MG/ML IJ SOLN
INTRAMUSCULAR | Status: DC | PRN
  Administered 2022-07-10: 30 mL via PERINEURAL

## 2022-07-10 MED ORDER — LIDOCAINE 2% (20 MG/ML) 5 ML SYRINGE
INTRAMUSCULAR | Status: DC | PRN
  Administered 2022-07-10: 30 mg via INTRAVENOUS

## 2022-07-10 MED ORDER — MIDAZOLAM HCL 2 MG/2ML IJ SOLN
1.0000 mg | Freq: Once | INTRAMUSCULAR | Status: AC
  Administered 2022-07-10: 1 mg via INTRAVENOUS

## 2022-07-10 MED ORDER — MIDAZOLAM HCL 2 MG/2ML IJ SOLN
INTRAMUSCULAR | Status: AC
  Filled 2022-07-10: qty 2

## 2022-07-10 MED ORDER — MIDAZOLAM HCL 5 MG/5ML IJ SOLN
INTRAMUSCULAR | Status: DC | PRN
  Administered 2022-07-10: 2 mg via INTRAVENOUS

## 2022-07-10 MED ORDER — PROPOFOL 500 MG/50ML IV EMUL
INTRAVENOUS | Status: DC | PRN
  Administered 2022-07-10: 25 ug/kg/min via INTRAVENOUS

## 2022-07-10 MED ORDER — ACETAMINOPHEN 500 MG PO TABS
ORAL_TABLET | ORAL | Status: AC
  Filled 2022-07-10: qty 2

## 2022-07-10 MED ORDER — HYDROMORPHONE HCL 1 MG/ML IJ SOLN
0.2500 mg | INTRAMUSCULAR | Status: DC | PRN
  Administered 2022-07-10 (×2): 0.5 mg via INTRAVENOUS

## 2022-07-10 MED ORDER — OXYCODONE-ACETAMINOPHEN 5-325 MG PO TABS: 1.0000 | ORAL_TABLET | Freq: Two times a day (BID) | ORAL | 0 refills | Status: AC | PRN

## 2022-07-10 MED ORDER — MEPERIDINE HCL 25 MG/ML IJ SOLN: 6.2500 mg | INTRAMUSCULAR | Status: DC | PRN

## 2022-07-10 MED ORDER — ACETAMINOPHEN 500 MG PO TABS
1000.0000 mg | ORAL_TABLET | Freq: Once | ORAL | Status: AC
  Administered 2022-07-10: 1000 mg via ORAL

## 2022-07-10 MED ORDER — CLONIDINE HCL (ANALGESIA) 100 MCG/ML EP SOLN
EPIDURAL | Status: DC | PRN
  Administered 2022-07-10: 80 ug

## 2022-07-10 MED ORDER — CELECOXIB 200 MG PO CAPS
200.0000 mg | ORAL_CAPSULE | Freq: Once | ORAL | Status: AC
  Administered 2022-07-10: 200 mg via ORAL

## 2022-07-10 MED ORDER — DEXAMETHASONE SODIUM PHOSPHATE 4 MG/ML IJ SOLN
INTRAMUSCULAR | Status: DC | PRN
  Administered 2022-07-10: 6 mg via PERINEURAL

## 2022-07-10 MED ORDER — FENTANYL CITRATE (PF) 100 MCG/2ML IJ SOLN
100.0000 ug | Freq: Once | INTRAMUSCULAR | Status: AC
  Administered 2022-07-10: 100 ug via INTRAVENOUS

## 2022-07-10 MED ORDER — ONDANSETRON HCL 4 MG PO TABS: 4.0000 mg | ORAL_TABLET | Freq: Three times a day (TID) | ORAL | 0 refills | Status: AC | PRN

## 2022-07-10 MED ORDER — CELECOXIB 200 MG PO CAPS
ORAL_CAPSULE | ORAL | Status: AC
  Filled 2022-07-10: qty 1

## 2022-07-10 MED ORDER — OXYCODONE HCL 5 MG/5ML PO SOLN: 5.0000 mg | Freq: Once | ORAL | Status: AC | PRN

## 2022-07-10 MED ORDER — CEFAZOLIN IN SODIUM CHLORIDE 3-0.9 GM/100ML-% IV SOLN
INTRAVENOUS | Status: AC
  Filled 2022-07-10: qty 100

## 2022-07-10 MED ORDER — FENTANYL CITRATE (PF) 100 MCG/2ML IJ SOLN
INTRAMUSCULAR | Status: DC | PRN
  Administered 2022-07-10 (×2): 50 ug via INTRAVENOUS

## 2022-07-10 MED ORDER — SODIUM CHLORIDE 0.9 % IR SOLN
Status: DC | PRN
  Administered 2022-07-10: 1000 mL

## 2022-07-10 MED ORDER — FENTANYL CITRATE (PF) 100 MCG/2ML IJ SOLN
INTRAMUSCULAR | Status: AC
  Filled 2022-07-10: qty 2

## 2022-07-10 MED ORDER — LACTATED RINGERS IV SOLN: INTRAVENOUS | Status: DC

## 2022-07-10 MED ORDER — IBUPROFEN 800 MG PO TABS: 800.0000 mg | ORAL_TABLET | Freq: Three times a day (TID) | ORAL | 2 refills | Status: AC | PRN

## 2022-07-10 MED ORDER — OXYCODONE HCL 5 MG PO TABS
5.0000 mg | ORAL_TABLET | Freq: Once | ORAL | Status: AC | PRN
  Administered 2022-07-10: 5 mg via ORAL

## 2022-07-10 MED ORDER — PROPOFOL 10 MG/ML IV BOLUS
INTRAVENOUS | Status: DC | PRN
  Administered 2022-07-10: 200 mg via INTRAVENOUS

## 2022-07-10 MED ORDER — CEFAZOLIN IN SODIUM CHLORIDE 3-0.9 GM/100ML-% IV SOLN
3.0000 g | INTRAVENOUS | Status: AC
  Administered 2022-07-10: 3 g via INTRAVENOUS

## 2022-07-10 MED ORDER — OXYCODONE HCL 5 MG PO TABS
ORAL_TABLET | ORAL | Status: AC
  Filled 2022-07-10: qty 1

## 2022-07-10 MED ORDER — PROMETHAZINE HCL 25 MG/ML IJ SOLN: 6.2500 mg | INTRAMUSCULAR | Status: DC | PRN

## 2022-07-10 SURGICAL SUPPLY — 103 items
APL SKNCLS STERI-STRIP NONHPOA (GAUZE/BANDAGES/DRESSINGS) ×1
BANDAGE ESMARK 6X9 LF (GAUZE/BANDAGES/DRESSINGS) ×1 IMPLANT
BENZOIN TINCTURE PRP APPL 2/3 (GAUZE/BANDAGES/DRESSINGS) ×1 IMPLANT
BLADE HEX COATED 2.75 (ELECTRODE) IMPLANT
BLADE SHAVER TORPEDO 4X13 (MISCELLANEOUS) ×1 IMPLANT
BLADE SURG 15 STRL LF DISP TIS (BLADE) ×2 IMPLANT
BLADE SURG 15 STRL SS (BLADE) ×2
BNDG CMPR 9X6 STRL LF SNTH (GAUZE/BANDAGES/DRESSINGS) ×1
BNDG ELASTIC 4X5.8 VLCR STR LF (GAUZE/BANDAGES/DRESSINGS) IMPLANT
BNDG ELASTIC 6X5.8 VLCR STR LF (GAUZE/BANDAGES/DRESSINGS) ×2 IMPLANT
BNDG ESMARK 6X9 LF (GAUZE/BANDAGES/DRESSINGS) ×1
CANISTER SUCT 1200ML W/VALVE (MISCELLANEOUS) ×1 IMPLANT
COOLER ICEMAN CLASSIC (MISCELLANEOUS) ×1 IMPLANT
COVER BACK TABLE 60X90IN (DRAPES) ×1 IMPLANT
CUFF TOURN SGL QUICK 24 (TOURNIQUET CUFF)
CUFF TOURN SGL QUICK 34 (TOURNIQUET CUFF) ×1
CUFF TRNQT CYL 24X4X16.5-23 (TOURNIQUET CUFF) IMPLANT
CUFF TRNQT CYL 34X4.125X (TOURNIQUET CUFF) ×1 IMPLANT
DRAPE ARTHROSCOPY W/POUCH 90 (DRAPES) ×1 IMPLANT
DRAPE C-ARM 42X72 X-RAY (DRAPES) ×1 IMPLANT
DRAPE C-ARMOR (DRAPES) ×1 IMPLANT
DRAPE EXTREMITY T 121X128X90 (DISPOSABLE) ×1 IMPLANT
DRAPE IMP U-DRAPE 54X76 (DRAPES) ×1 IMPLANT
DRAPE INCISE IOBAN 66X45 STRL (DRAPES) IMPLANT
DRAPE POUCH INSTRU U-SHP 10X18 (DRAPES) ×1 IMPLANT
DRAPE SURG 17X23 STRL (DRAPES) IMPLANT
DRAPE U-SHAPE 47X51 STRL (DRAPES) ×1 IMPLANT
DURAPREP 26ML APPLICATOR (WOUND CARE) ×1 IMPLANT
ELECT MENISCUS 165MM 90D (ELECTRODE) IMPLANT
ELECT REM PT RETURN 9FT ADLT (ELECTROSURGICAL) ×1
ELECTRODE REM PT RTRN 9FT ADLT (ELECTROSURGICAL) ×1 IMPLANT
GAUZE PAD ABD 8X10 STRL (GAUZE/BANDAGES/DRESSINGS) ×1 IMPLANT
GAUZE SPONGE 4X4 12PLY STRL (GAUZE/BANDAGES/DRESSINGS) ×2 IMPLANT
GAUZE XEROFORM 1X8 LF (GAUZE/BANDAGES/DRESSINGS) ×1 IMPLANT
GLOVE ECLIPSE 7.0 STRL STRAW (GLOVE) ×1 IMPLANT
GLOVE INDICATOR 7.0 STRL GRN (GLOVE) ×1 IMPLANT
GLOVE INDICATOR 7.5 STRL GRN (GLOVE) ×1 IMPLANT
GLOVE SURG SYN 7.5  E (GLOVE) ×1
GLOVE SURG SYN 7.5 E (GLOVE) ×1 IMPLANT
GLOVE SURG SYN 7.5 PF PI (GLOVE) ×1 IMPLANT
GOWN STRL REUS W/ TWL LRG LVL3 (GOWN DISPOSABLE) ×1 IMPLANT
GOWN STRL REUS W/ TWL XL LVL3 (GOWN DISPOSABLE) ×1 IMPLANT
GOWN STRL REUS W/TWL LRG LVL3 (GOWN DISPOSABLE) ×1
GOWN STRL REUS W/TWL XL LVL3 (GOWN DISPOSABLE) ×1
GOWN STRL SURGICAL XL XLNG (GOWN DISPOSABLE) ×1 IMPLANT
GRAFT TISS 230-320 GRACILIS (Bone Implant) IMPLANT
IMMOBILIZER KNEE 22 UNIV (SOFTGOODS) IMPLANT
IMMOBILIZER KNEE 24 THIGH 36 (MISCELLANEOUS) IMPLANT
IMMOBILIZER KNEE 24 UNIV (MISCELLANEOUS)
KIT TRANSTIBIAL (DISPOSABLE) ×1 IMPLANT
MANIFOLD NEPTUNE II (INSTRUMENTS) ×1 IMPLANT
NDL SUT 6 .5 CRC .975X.05 MAYO (NEEDLE) IMPLANT
NEEDLE HYPO 22GX1.5 SAFETY (NEEDLE) IMPLANT
NEEDLE MAYO TAPER (NEEDLE)
NS IRRIG 1000ML POUR BTL (IV SOLUTION) ×1 IMPLANT
PACK ARTHROSCOPY DSU (CUSTOM PROCEDURE TRAY) ×1 IMPLANT
PACK BASIN DAY SURGERY FS (CUSTOM PROCEDURE TRAY) ×1 IMPLANT
PACK IMPLANT BIOCOMPOSITE MPFL (Orthopedic Implant) IMPLANT
PAD CAST 3X4 CTTN HI CHSV (CAST SUPPLIES) IMPLANT
PAD CAST 4YDX4 CTTN HI CHSV (CAST SUPPLIES) IMPLANT
PAD COLD SHLDR WRAP-ON (PAD) ×1 IMPLANT
PADDING CAST COTTON 3X4 STRL (CAST SUPPLIES)
PADDING CAST COTTON 4X4 STRL (CAST SUPPLIES)
PADDING CAST COTTON 6X4 STRL (CAST SUPPLIES) ×1 IMPLANT
PADDING CAST SYNTHETIC 4X4 STR (CAST SUPPLIES) IMPLANT
PADDING CAST SYNTHETIC 6X4 NS (CAST SUPPLIES) IMPLANT
PENCIL SMOKE EVACUATOR (MISCELLANEOUS) ×1 IMPLANT
SHEET MEDIUM DRAPE 40X70 STRL (DRAPES) ×1 IMPLANT
SLEEVE SCD COMPRESS KNEE MED (STOCKING) IMPLANT
SPIKE FLUID TRANSFER (MISCELLANEOUS) IMPLANT
SPONGE T-LAP 18X18 ~~LOC~~+RFID (SPONGE) ×1 IMPLANT
STAPLER VISISTAT (STAPLE) IMPLANT
STOCKINETTE 4X48 STRL (DRAPES) IMPLANT
STOCKINETTE 6  STRL (DRAPES)
STOCKINETTE 6 STRL (DRAPES) IMPLANT
STRIP CLOSURE SKIN 1/2X4 (GAUZE/BANDAGES/DRESSINGS) ×1 IMPLANT
SUCTION FRAZIER HANDLE 10FR (MISCELLANEOUS) ×1
SUCTION TUBE FRAZIER 10FR DISP (MISCELLANEOUS) ×1 IMPLANT
SUT ETHILON 2 0 FS 18 (SUTURE) IMPLANT
SUT ETHILON 3 0 PS 1 (SUTURE) ×1 IMPLANT
SUT ETHILON 4 0 PS 2 18 (SUTURE) IMPLANT
SUT FIBERWIRE #2 38 T-5 BLUE (SUTURE)
SUT VIC AB 0 CT1 27 (SUTURE)
SUT VIC AB 0 CT1 27XBRD ANBCTR (SUTURE) IMPLANT
SUT VIC AB 1 CT1 27 (SUTURE)
SUT VIC AB 1 CT1 27XBRD ANBCTR (SUTURE) IMPLANT
SUT VIC AB 2-0 CT1 27 (SUTURE)
SUT VIC AB 2-0 CT1 TAPERPNT 27 (SUTURE) IMPLANT
SUT VIC AB 3-0 FS2 27 (SUTURE) IMPLANT
SUT VIC AB 3-0 SH 27 (SUTURE) ×1
SUT VIC AB 3-0 SH 27X BRD (SUTURE) ×1 IMPLANT
SUTURE FIBERWR #2 38 T-5 BLUE (SUTURE) IMPLANT
SUTURE TAPE 1.3 FIBERLOP 20 ST (SUTURE) ×2 IMPLANT
SUTURETAPE 1.3 FIBERLOOP 20 ST (SUTURE) ×2
SYR BULB EAR ULCER 3OZ GRN STR (SYRINGE) ×1 IMPLANT
SYR CONTROL 10ML LL (SYRINGE) IMPLANT
TENDON GRACILIS FROZEN (Bone Implant) ×1 IMPLANT
TENDON GRACILIS FROZEN 230-320 (Bone Implant) ×1 IMPLANT
TOWEL GREEN STERILE FF (TOWEL DISPOSABLE) ×1 IMPLANT
TUBING ARTHROSCOPY IRRIG 16FT (MISCELLANEOUS) ×1 IMPLANT
UNDERPAD 30X36 HEAVY ABSORB (UNDERPADS AND DIAPERS) ×1 IMPLANT
WATER STERILE IRR 1000ML POUR (IV SOLUTION) ×1 IMPLANT
YANKAUER SUCT BULB TIP NO VENT (SUCTIONS) ×1 IMPLANT

## 2022-07-10 NOTE — H&P (Signed)
    PREOPERATIVE H&P  Chief Complaint: right knee medial patellofermoral ligament rupture, loose body  HPI: Tanner Figueroa is a 26 y.o. male who presents for surgical treatment of right knee medial patellofermoral ligament rupture, loose body.  He denies any changes in medical history.  Past Medical History:  Diagnosis Date   Eczema    OCD (obsessive compulsive disorder)    Patellar disorder    Past Surgical History:  Procedure Laterality Date   WISDOM TOOTH EXTRACTION     Social History   Socioeconomic History   Marital status: Single    Spouse name: Not on file   Number of children: Not on file   Years of education: Not on file   Highest education level: Not on file  Occupational History   Not on file  Tobacco Use   Smoking status: Former    Types: Cigarettes    Quit date: 07/04/2014    Years since quitting: 8.0   Smokeless tobacco: Never  Vaping Use   Vaping Use: Every day  Substance and Sexual Activity   Alcohol use: Yes    Alcohol/week: 7.0 standard drinks of alcohol    Types: 7 Cans of beer per week   Drug use: Yes    Types: Marijuana   Sexual activity: Yes  Other Topics Concern   Not on file  Social History Narrative   Not on file   Social Determinants of Health   Financial Resource Strain: Not on file  Food Insecurity: Not on file  Transportation Needs: Not on file  Physical Activity: Not on file  Stress: Not on file  Social Connections: Not on file   Family History  Problem Relation Age of Onset   Mental illness Mother    Allergies  Allergen Reactions   Penicillins     Childhood- mother was  Allergic, not pt   Hydrocodone Itching and Nausea And Vomiting   Prior to Admission medications   Medication Sig Start Date End Date Taking? Authorizing Provider  ibuprofen (ADVIL) 800 MG tablet Take 1 tablet (800 mg total) by mouth 3 (three) times daily. 03/08/22  Yes Palumbo, April, MD     Positive ROS: All other systems have been reviewed and  were otherwise negative with the exception of those mentioned in the HPI and as above.  Physical Exam: General: Alert, no acute distress Cardiovascular: No pedal edema Respiratory: No cyanosis, no use of accessory musculature GI: abdomen soft Skin: No lesions in the area of chief complaint Neurologic: Sensation intact distally Psychiatric: Patient is competent for consent with normal mood and affect Lymphatic: no lymphedema  MUSCULOSKELETAL: exam stable  Assessment: right knee medial patellofermoral ligament rupture, loose body  Plan: Plan for Procedure(s): RIGHT KNEE ARTHROSCOPY WITH MEDIAL PATELLAR FEMORAL LIGAMENT RECONSTRUCTION FOREIGN BODY REMOVAL ADULT LATERAL RELEASE  The risks benefits and alternatives were discussed with the patient including but not limited to the risks of nonoperative treatment, versus surgical intervention including infection, bleeding, nerve injury,  blood clots, cardiopulmonary complications, morbidity, mortality, among others, and they were willing to proceed.   Glee Arvin, MD 07/10/2022 10:44 AM

## 2022-07-10 NOTE — Anesthesia Preprocedure Evaluation (Addendum)
Anesthesia Evaluation  Patient identified by MRN, date of birth, ID band Patient awake    Reviewed: Allergy & Precautions, NPO status , Patient's Chart, lab work & pertinent test results  Airway Mallampati: I  TM Distance: >3 FB Neck ROM: Full    Dental no notable dental hx. (+) Dental Advisory Given, Teeth Intact   Pulmonary Patient abstained from smoking., former smoker   Pulmonary exam normal breath sounds clear to auscultation       Cardiovascular negative cardio ROS Normal cardiovascular exam Rhythm:Regular Rate:Normal     Neuro/Psych  PSYCHIATRIC DISORDERS Anxiety     negative neurological ROS     GI/Hepatic negative GI ROS, Neg liver ROS,,,  Endo/Other  negative endocrine ROS    Renal/GU negative Renal ROS     Musculoskeletal negative musculoskeletal ROS (+)    Abdominal   Peds  Hematology negative hematology ROS (+)   Anesthesia Other Findings   Reproductive/Obstetrics                             Anesthesia Physical Anesthesia Plan  ASA: 2  Anesthesia Plan: General   Post-op Pain Management: Tylenol PO (pre-op)*, Celebrex PO (pre-op)* and Regional block*   Induction: Intravenous  PONV Risk Score and Plan: 2 and Ondansetron, Dexamethasone, Treatment may vary due to age or medical condition and Midazolam  Airway Management Planned: LMA  Additional Equipment:   Intra-op Plan:   Post-operative Plan: Extubation in OR  Informed Consent: I have reviewed the patients History and Physical, chart, labs and discussed the procedure including the risks, benefits and alternatives for the proposed anesthesia with the patient or authorized representative who has indicated his/her understanding and acceptance.     Dental advisory given  Plan Discussed with: CRNA  Anesthesia Plan Comments:        Anesthesia Quick Evaluation

## 2022-07-10 NOTE — Anesthesia Postprocedure Evaluation (Signed)
Anesthesia Post Note  Patient: [name]  Procedure(s) Performed: RIGHT KNEE ARTHROSCOPY WITH MEDIAL PATELLAR FEMORAL LIGAMENT RECONSTRUCTION (Right: Knee) FOREIGN BODY REMOVAL ADULT (Right: Knee) LATERAL RELEASE (Right: Knee)     Patient location during evaluation: PACU Anesthesia Type: General Level of consciousness: awake Pain management: pain level controlled Vital Signs Assessment: post-procedure vital signs reviewed and stable Respiratory status: spontaneous breathing, nonlabored ventilation and respiratory function stable Cardiovascular status: blood pressure returned to baseline and stable Postop Assessment: no apparent nausea or vomiting Anesthetic complications: no   No notable events documented.  Last Vitals:  Vitals:   07/10/22 1524 07/10/22 1530  BP:  139/80  Pulse: 63 (!) 59  Resp: 13 16  Temp:    SpO2: 99% 97%    Last Pain:  Vitals:   07/10/22 1530  TempSrc:   PainSc: 5                  Tanner Figueroa

## 2022-07-10 NOTE — Transfer of Care (Signed)
Immediate Anesthesia Transfer of Care Note  Patient: Tanner Figueroa  Procedure(s) Performed: RIGHT KNEE ARTHROSCOPY WITH MEDIAL PATELLAR FEMORAL LIGAMENT RECONSTRUCTION (Right: Knee) FOREIGN BODY REMOVAL ADULT (Right: Knee) LATERAL RELEASE (Right: Knee)  Patient Location: PACU  Anesthesia Type:GA combined with regional for post-op pain  Level of Consciousness: awake, alert , oriented, and patient cooperative  Airway & Oxygen Therapy: Patient Spontanous Breathing and Patient connected to face mask oxygen  Post-op Assessment: Report given to RN and Post -op Vital signs reviewed and stable  Post vital signs: Reviewed and stable  Last Vitals:  Vitals Value Taken Time  BP    Temp    Pulse    Resp    SpO2      Last Pain:  Vitals:   07/10/22 1037  TempSrc: Oral  PainSc: 2       Patients Stated Pain Goal: 9 (07/10/22 1037)  Complications: No notable events documented.

## 2022-07-10 NOTE — Discharge Instructions (Addendum)
Post-operative patient instructions  Knee Arthroscopy   Ice:  Place intermittent ice or cooler pack over your knee, 30 minutes on and 30 minutes off.  Continue this for the first 72 hours after surgery, then save ice for use after therapy sessions or on more active days.   Weight:  You may bear weight on your leg as your symptoms allow. Crutches:  Use crutches (or walker) to assist in walking until told to discontinue by your physical therapist or physician. This will help to reduce pain. Strengthening:  Perform simple thigh squeezes (isometric quad contractions) and straight leg lifts as you are able (3 sets of 5 to 10 repetitions, 3 times a day).  For the leg lifts, have someone support under your ankle in the beginning until you have increased strength enough to do this on your own.  To help get started on thigh squeezes, place a pillow under your knee and push down on the pillow with back of knee (sometimes easier to do than with your leg fully straight). Motion:  Perform gentle knee motion as tolerated - this is gentle bending and straightening of the knee. Seated heel slides: you can start by sitting in a chair, remove your brace, and gently slide your heel back on the floor - allowing your knee to bend. Have someone help you straighten your knee (or use your other leg/foot hooked under your ankle.  Dressing:  Perform 1st dressing change at 2 days postoperative. A moderate amount of blood tinged drainage is to be expected.  So if you bleed through the dressing on the first or second day or if you have fevers, it is fine to change the dressing/check the wounds early and redress wound. Elevate your leg.  If it bleeds through again, or if the incisions are leaking frank blood, please call the office. May change dressing every 1-2 days thereafter to help watch wounds. Can purchase Tegaderm (or 1M Nexcare) water resistant dressings at local pharmacy / [employer]. Shower:  Light shower is ok after 2  days.  Please take shower, NO bath. Recover with gauze and ace wrap to help keep wounds protected.   Pain medication:  A narcotic pain medication has been prescribed.  Take as directed.  Typically you need narcotic pain medication more regularly during the first 3 to 5 days after surgery.  Decrease your use of the medication as the pain improves.  Narcotics can sometimes cause constipation, even after a few doses.  If you have problems with constipation, you can take an over the counter stool softener or light laxative.  If you have persistent problems, please notify your physician's office. Physical therapy: Additional activity guidelines to be provided by your physician or physical therapist at follow-up visits.  Driving: Do not recommend driving x 1 week post surgical, especially if surgery performed on right side. Should not drive while taking narcotic pain medications. It typically takes at least 1 week to restore sufficient neuromuscular function for normal reaction times for driving safety.  Call [phone] for questions or problems. Evenings you will be forwarded to the hospital operator.  Ask for the orthopaedic physician on call. Please call if you experience:    Redness, foul smelling, or persistent drainage from the surgical site  worsening knee pain and swelling not responsive to medication  any calf pain and or swelling of the lower leg  temperatures greater than 101.5 F other questions or concerns   Thank you for allowing [place] to be  a part of your care.    Post Anesthesia Home Care Instructions  Activity: Get plenty of rest for the remainder of the day. A responsible individual must stay with you for 24 hours following the procedure.  For the next 24 hours, DO NOT: -Drive a car -Advertising copywriter -Drink alcoholic beverages -Take any medication unless instructed by your physician -Make any legal decisions or sign important papers.  Meals: Start with liquid foods such as  gelatin or soup. Progress to regular foods as tolerated. Avoid greasy, spicy, heavy foods. If nausea and/or vomiting occur, drink only clear liquids until the nausea and/or vomiting subsides. Call your physician if vomiting continues.  Special Instructions/Symptoms: Your throat may feel dry or sore from the anesthesia or the breathing tube placed in your throat during surgery. If this causes discomfort, gargle with warm salt water. The discomfort should disappear within 24 hours.  If you had a scopolamine patch placed behind your ear for the management of post- operative nausea and/or vomiting:  1. The medication in the patch is effective for 72 hours, after which it should be removed.  Wrap patch in a tissue and discard in the trash. Wash hands thoroughly with soap and water. 2. You may remove the patch earlier than 72 hours if you experience unpleasant side effects which may include dry mouth, dizziness or visual disturbances. 3. Avoid touching the patch. Wash your hands with soap and water after contact with the patch.  Regional Anesthesia Blocks  1. Numbness or the inability to move the "blocked" extremity may last from 3-48 hours after placement. The length of time depends on the medication injected and your individual response to the medication. If the numbness is not going away after 48 hours, call your surgeon.  2. The extremity that is blocked will need to be protected until the numbness is gone and the  Strength has returned. Because you cannot feel it, you will need to take extra care to avoid injury. Because it may be weak, you may have difficulty moving it or using it. You may not know what position it is in without looking at it while the block is in effect.  3. For blocks in the legs and feet, returning to weight bearing and walking needs to be done carefully. You will need to wait until the numbness is entirely gone and the strength has returned. You should be able to move your leg  and foot normally before you try and bear weight or walk. You will need someone to be with you when you first try to ensure you do not fall and possibly risk injury.  4. Bruising and tenderness at the needle site are common side effects and will resolve in a few days.  5. Persistent numbness or new problems with movement should be communicated to the surgeon or the [facility] [phone] [facility] [phone]).  No tylenol until after 4:40pm today if needed No motrin until after 5pm today if needed

## 2022-07-10 NOTE — Progress Notes (Signed)
Assisted Dr. Germeroth with right, adductor canal, ultrasound guided block. Side rails up, monitors on throughout procedure. See vital signs in flow sheet. Tolerated Procedure well. ? ?

## 2022-07-10 NOTE — Anesthesia Procedure Notes (Signed)
Procedure Name: LMA Insertion Date/Time: 07/10/2022 12:32 PM  Performed by: Lavert Matousek, Jewel Baize, CRNAPre-anesthesia Checklist: Patient identified, Emergency Drugs available, Suction available and Patient being monitored Patient Re-evaluated:Patient Re-evaluated prior to induction Oxygen Delivery Method: Circle system utilized Preoxygenation: Pre-oxygenation with 100% oxygen Induction Type: IV induction Ventilation: Mask ventilation without difficulty LMA: LMA inserted LMA Size: 5.0 Number of attempts: 1 Airway Equipment and Method: Bite block Placement Confirmation: positive ETCO2 Tube secured with: Tape Dental Injury: Teeth and Oropharynx as per pre-operative assessment

## 2022-07-10 NOTE — Anesthesia Procedure Notes (Signed)
Anesthesia Regional Block: Adductor canal block   Pre-Anesthetic Checklist: , timeout performed,  Correct Patient, Correct Site, Correct Laterality,  Correct Procedure, Correct Position, site marked,  Risks and benefits discussed,  Surgical consent,  Pre-op evaluation,  At surgeon's request and post-op pain management  Laterality: Lower and Right  Prep: chloraprep       Needles:  Injection technique: Single-shot  Needle Type: Stimiplex     Needle Length: 9cm  Needle Gauge: 21     Additional Needles:   Procedures:,,,, ultrasound used (permanent image in chart),,    Narrative:  Start time: 07/10/2022 11:26 AM End time: 07/10/2022 11:46 AM Injection made incrementally with aspirations every 5 mL.  Performed by: Personally  Anesthesiologist: Lewie Loron, MD  Additional Notes: BP cuff, EKG monitors applied. Sedation begun. Artery and nerve location verified with ultrasound. Anesthetic injected incrementally (9ml), slowly, and after negative aspirations under direct u/s guidance. Good fascial/perineural spread. Tolerated well.

## 2022-07-10 NOTE — Op Note (Addendum)
Surgery Date: 07/10/2022  PREOPERATIVE DIAGNOSES:  1.  Traumatic rupture of right knee medial patellofemoral ligament 2.  Loose body right knee 3.  Chondromalacia of patella 4.  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   Right knee arthroscopy with arthroscopic chondroplasty of patella 3.   Reconstruction of right knee medial patellofemoral ligament 4.   Right knee arthroscopy with lateral release  SURGEON: Tanner Figueroa, M.D.  ASSIST: Tanner Figueroa, [place]; necessary for the timely completion of procedure and due to complexity of procedure.  ANESTHESIA:  general  FLUIDS: Per anesthesia record.   ESTIMATED BLOOD LOSS: minimal  Implant Name Type Inv. Item Serial No. Manufacturer Lot No. LRB No. Used Action  PACK IMPLANT BIOCOMPOSITE MPFL - [id number] Orthopedic Implant PACK IMPLANT BIOCOMPOSITE MPFL  [employer] [id number] Right 1 Implanted  TENDON GRACILIS FROZEN - [id number] Bone Implant TENDON GRACILIS FROZEN [id number] [facility] [id number] Right 1 Implanted    DESCRIPTION OF PROCEDURE: Tanner Figueroa is a 26 y.o.-year-old male with above mentioned conditions. Full discussion held regarding risks benefits alternatives and complications related surgical intervention. Conservative care options reviewed. All questions answered.  The patient was identified in the preoperative holding area and the operative extremity was marked. The patient was brought to the operating room and transferred to operating table in a supine position. Satisfactory general anesthesia was induced by anesthesiology.  Examination of the right knee showed laterally tracking patella with greater than 2 quadrants of  lateral patellar mobility.  Remaining portions of the examination were unremarkable.  Standard anterolateral, anteromedial arthroscopy portals were obtained. The anteromedial portal was obtained with a spinal needle for localization under direct visualization with subsequent diagnostic findings.   We first evaluate the medial compartment which was unremarkable.  The cruciates were unremarkable.  Leg was then placed into a figure-of-four position.  There is a small radial tear of the anterior horn of the lateral meniscus.  This was debrided back to stable margins with a oscillating shaver.  We then looked posteriorly to look for the loose body and I was unable to find through probing the loose body that corresponded to the MRI findings.  The chondral surfaces were unremarkable.  Popliteus tendon was unremarkable.  We then placed the knee in full extension.  We found that the patella tracked laterally with the apex of the patella directly over the lateral trochlear ridge.  There was grade II-III chondromalacia that was presumably from the traumatic patellar dislocation.  Chondroplasty was performed with a full-radius shaver back to stable margins.  No full-thickness chondral defects were noted.  Lateral retinacular release was then performed from the superior border of the patella to the inferior border with an arthroscopic bovie.  The arthroscope was then removed from the joint and we turned our attention to reconstruction of the MPFL.  A 5 cm incision was made on the medial border of the patella.  Dissection was carried down to the patella.  Gracilis allograft was prepared on the back table by my PA Tanner Figueroa.  The graft measured 4.5 mm x 210 mm.  The ends of the graft were attached to 4.75 mm swivel locks.  Under fluoroscopic guidance the location of the bone tunnels were determined.  The tunnels were placed at the junction of the upper third  in the middle third as well as the middle third and the lower third of  the patella.  These were drilled 20 mm deep and the ends of the graft were delivered into the tunnels and interference fit was achieved with the swivel locks.  The bone quality was excellent.  We then used the radiolucent guide to find the isometric point of the attachment of the MPFL.  The pin was brought out laterally.  A subcutaneous tunnel was then developed and the graft was then pulled into the medial femoral condyle.  The appropriate tension was determined and under the appropriate tension the graft was secured with a 6 x 20 mm interference screw.  Again the fixation was excellent.  Patellar mobility was 1 quadrant.  The arthroscope was then placed back into the joint.  We are able to restore normal patellar tracking with the apex of the patella facing the trochlear groove.  Excess fluid was removed from the knee joint.  Incisions were closed with interrupted nylon sutures.  Sterile dressings were applied.  Patient tolerated procedure well had no immediate complications.  Tanner Figueroa was critical for the time of the completion of the surgery and due to its complexity.  She was critical for opening, closing, limb positioning, overall facilitation of the surgery.  Suprapatellar pouch and gutters: no synovitis or debris. Patella chondral surface: Grade 2-3 Trochlear chondral surface: Grade 0 Medial meniscus: Normal.  Medial femoral condyle weight bearing surface: Grade 0 Medial tibial plateau: Grade 0 Anterior cruciate ligament:stable Posterior cruciate ligament:stable Lateral meniscus: Small radial tear anterior horn.   Lateral femoral condyle weight bearing surface: Grade 0 Lateral tibial plateau: Grade 0  DISPOSITION: The patient was awakened from general anesthetic, extubated, taken to the recovery room in medically stable condition, no apparent complications. The patient may be weightbearing as tolerated to the operative lower extremity.  He will be placed in a Bledsoe brace with  range of motion from 0 to 30 degrees.  Crutches as needed.  Follow-up in 1 week for suture removal.  N. Glee Arvin, MD [facility] 2:16 PM

## 2022-07-17 ENCOUNTER — Ambulatory Visit (INDEPENDENT_AMBULATORY_CARE_PROVIDER_SITE_OTHER): Payer: Medicaid Other | Admitting: Orthopaedic Surgery

## 2022-07-17 DIAGNOSIS — S76111A Strain of right quadriceps muscle, fascia and tendon, initial encounter: Secondary | ICD-10-CM

## 2022-07-17 DIAGNOSIS — M25861 Other specified joint disorders, right knee: Secondary | ICD-10-CM

## 2022-07-17 DIAGNOSIS — S83281A Other tear of lateral meniscus, current injury, right knee, initial encounter: Secondary | ICD-10-CM

## 2022-07-17 NOTE — Progress Notes (Signed)
   Post-Op Visit Note   Patient: Tanner Figueroa           Date of Birth: 05-22-1996           MRN: [id number] Visit Date: 07/17/2022 PCP: Patient, No Pcp Per   Assessment & Plan:  Chief Complaint:  Chief Complaint  Patient presents with   Right Knee - Routine Post Op   Visit Diagnoses:  1. Traumatic avulsion of patellofemoral ligament, right, initial encounter   2. Patellofemoral dysfunction of right knee   3. Acute lateral meniscus tear of right knee, initial encounter     Plan: Mr. Amparo is following up for her first postop visit status post right knee arthroscopy lateral release, lateral meniscal debridement and MPFL reconstruction.  Overall he is doing well and his symptoms are mild.  Examination of right knee shows intact and healed surgical incisions.  No signs of infection or drainage.  Neurovascular intact.  Expected postoperative findings.  Sutures removed Steri-Strips applied.  We will place him in a PSO brace and begin range of motion.  I made a referral to outpatient PT for knee range of motion, gait training, strengthening.  Follow-up in 3 weeks for recheck.  Follow-Up Instructions: Return in about 3 weeks (around 08/07/2022).   Orders:  Orders Placed This Encounter  Procedures   Ambulatory referral to Physical Therapy   No orders of the defined types were placed in this encounter.   Imaging: No results found.  PMFS History: Patient Active Problem List   Diagnosis Date Noted   Acute lateral meniscus tear of right knee 07/10/2022   Traumatic avulsion of patellofemoral ligament, right, initial encounter 05/09/2022   Patellofemoral dysfunction of right knee 05/09/2022   Chondral loose body of right knee joint 05/09/2022   Past Medical History:  Diagnosis Date   Eczema    OCD (obsessive compulsive disorder)    Patellar disorder     Family History  Problem Relation Age of Onset   Mental illness Mother     Past Surgical History:  Procedure  Laterality Date   WISDOM TOOTH EXTRACTION     Social History   Occupational History   Not on file  Tobacco Use   Smoking status: Former    Types: Cigarettes    Quit date: 07/04/2014    Years since quitting: 8.0   Smokeless tobacco: Never  Vaping Use   Vaping Use: Every day  Substance and Sexual Activity   Alcohol use: Yes    Alcohol/week: 7.0 standard drinks of alcohol    Types: 7 Cans of beer per week   Drug use: Yes    Types: Marijuana   Sexual activity: Yes
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
## 2022-07-23 ENCOUNTER — Ambulatory Visit: Payer: Medicaid Other | Attending: Orthopaedic Surgery | Admitting: Physical Therapy

## 2022-07-23 ENCOUNTER — Encounter: Payer: Self-pay | Admitting: Physical Therapy

## 2022-07-23 ENCOUNTER — Other Ambulatory Visit: Payer: Self-pay

## 2022-07-23 DIAGNOSIS — M25561 Pain in right knee: Secondary | ICD-10-CM | POA: Diagnosis present

## 2022-07-23 DIAGNOSIS — R600 Localized edema: Secondary | ICD-10-CM | POA: Diagnosis present

## 2022-07-23 DIAGNOSIS — M6281 Muscle weakness (generalized): Secondary | ICD-10-CM | POA: Diagnosis present

## 2022-07-23 DIAGNOSIS — S76111A Strain of right quadriceps muscle, fascia and tendon, initial encounter: Secondary | ICD-10-CM | POA: Insufficient documentation

## 2022-07-23 DIAGNOSIS — R2689 Other abnormalities of gait and mobility: Secondary | ICD-10-CM | POA: Diagnosis present

## 2022-07-23 DIAGNOSIS — S83281A Other tear of lateral meniscus, current injury, right knee, initial encounter: Secondary | ICD-10-CM | POA: Diagnosis not present

## 2022-07-23 DIAGNOSIS — M25861 Other specified joint disorders, right knee: Secondary | ICD-10-CM | POA: Insufficient documentation

## 2022-07-24 ENCOUNTER — Encounter (HOSPITAL_BASED_OUTPATIENT_CLINIC_OR_DEPARTMENT_OTHER): Payer: Self-pay | Admitting: Orthopaedic Surgery

## 2022-07-25 ENCOUNTER — Telehealth: Payer: Self-pay | Admitting: Orthopaedic Surgery

## 2022-07-25 NOTE — Telephone Encounter (Signed)
Patient came in asking about the 3x brace he was supposed to receive being we only had a 2x in office but because of the swelling he could not fit it, patient also stated he is supposed to be doing PT but Dr did not leave the PT person any restrictions and what exactly to focus on please advise

## 2022-07-25 NOTE — Telephone Encounter (Signed)
Tried to call patient. Just rang. No voicemail.

## 2022-07-26 ENCOUNTER — Ambulatory Visit: Payer: Medicaid Other

## 2022-07-26 NOTE — Telephone Encounter (Signed)
Spoke with patient. He will come in this morning to be fitted with brace. Right XXXL

## 2022-07-30 ENCOUNTER — Telehealth: Payer: Self-pay | Admitting: Orthopaedic Surgery

## 2022-07-30 NOTE — Telephone Encounter (Signed)
Brace at all times.  Is he in PT?

## 2022-07-30 NOTE — Telephone Encounter (Signed)
Patient called in stating he would like to know what his restrictions are specifically he was not told and it was not in the notes

## 2022-07-31 ENCOUNTER — Ambulatory Visit: Payer: Medicaid Other | Attending: Orthopaedic Surgery | Admitting: Physical Therapy

## 2022-07-31 ENCOUNTER — Encounter (HOSPITAL_BASED_OUTPATIENT_CLINIC_OR_DEPARTMENT_OTHER): Payer: Self-pay | Admitting: Orthopaedic Surgery

## 2022-07-31 DIAGNOSIS — M25561 Pain in right knee: Secondary | ICD-10-CM | POA: Diagnosis present

## 2022-07-31 DIAGNOSIS — R600 Localized edema: Secondary | ICD-10-CM | POA: Diagnosis present

## 2022-07-31 DIAGNOSIS — M6281 Muscle weakness (generalized): Secondary | ICD-10-CM | POA: Diagnosis present

## 2022-07-31 DIAGNOSIS — R2689 Other abnormalities of gait and mobility: Secondary | ICD-10-CM | POA: Diagnosis present
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
## 2022-08-02 ENCOUNTER — Encounter: Payer: Self-pay | Admitting: Physical Therapy

## 2022-08-02 ENCOUNTER — Ambulatory Visit: Payer: Medicaid Other | Admitting: Physical Therapy

## 2022-08-02 DIAGNOSIS — R2689 Other abnormalities of gait and mobility: Secondary | ICD-10-CM

## 2022-08-02 DIAGNOSIS — R600 Localized edema: Secondary | ICD-10-CM

## 2022-08-02 DIAGNOSIS — M25561 Pain in right knee: Secondary | ICD-10-CM | POA: Diagnosis not present

## 2022-08-02 DIAGNOSIS — M6281 Muscle weakness (generalized): Secondary | ICD-10-CM
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
## 2022-08-06 ENCOUNTER — Ambulatory Visit: Payer: Medicaid Other | Admitting: Physical Therapy

## 2022-08-06 ENCOUNTER — Encounter: Payer: Self-pay | Admitting: Physical Therapy

## 2022-08-06 DIAGNOSIS — M25561 Pain in right knee: Secondary | ICD-10-CM | POA: Diagnosis not present

## 2022-08-06 DIAGNOSIS — M6281 Muscle weakness (generalized): Secondary | ICD-10-CM

## 2022-08-06 DIAGNOSIS — R600 Localized edema: Secondary | ICD-10-CM

## 2022-08-06 DIAGNOSIS — R2689 Other abnormalities of gait and mobility: Secondary | ICD-10-CM

## 2022-08-07 ENCOUNTER — Encounter: Payer: Self-pay | Admitting: Orthopaedic Surgery

## 2022-08-07 ENCOUNTER — Ambulatory Visit (INDEPENDENT_AMBULATORY_CARE_PROVIDER_SITE_OTHER): Payer: Medicaid Other | Admitting: Physician Assistant

## 2022-08-07 DIAGNOSIS — S76111A Strain of right quadriceps muscle, fascia and tendon, initial encounter: Secondary | ICD-10-CM

## 2022-08-07 MED ORDER — TRAMADOL HCL 50 MG PO TABS: 50.0000 mg | ORAL_TABLET | Freq: Two times a day (BID) | ORAL | 2 refills | Status: AC | PRN

## 2022-08-07 NOTE — Progress Notes (Signed)
Office Visit Note   Patient: Tanner Figueroa           Date of Birth: 21-May-1996           MRN: [id number] Visit Date: 08/07/2022              Requested by: No referring provider defined for this encounter. PCP: Patient, No Pcp Per   Assessment & Plan: Visit Diagnoses:  1. Traumatic avulsion of patellofemoral ligament, right, initial encounter     Plan: Impression is 6 weeks status post right knee MPFL reconstruction.  Patient is doing well.  Will continue wearing his PSO brace.  Continue with physical therapy.  Work note provided.  Follow-up in 6 weeks for recheck.  Call with concerns or questions.  Follow-Up Instructions: Return in about 6 weeks (around 09/18/2022).   Orders:  No orders of the defined types were placed in this encounter.  Meds ordered this encounter  Medications   traMADol (ULTRAM) 50 MG tablet    Sig: Take 1 tablet (50 mg total) by mouth every 12 (twelve) hours as needed.    Dispense:  30 tablet    Refill:  2      Procedures: No procedures performed   Clinical Data: No additional findings.   Subjective: Chief Complaint  Patient presents with   Right Knee - Follow-up    Right knee scope 07/10/2022    HPI patient is a pleasant 27 year old who comes in today 6 weeks status post right knee MPFL reconstruction 07/10/2022.  He has been doing well.  He has been in physical therapy making good progress.  He has been compliant wearing his PSO brace.     Objective: Vital Signs: There were no vitals taken for this visit.    Ortho Exam right knee exam reveals a well-healed surgical incision without complication.  Range of motion 0 to 95 degrees.  He is neurovascular intact distally.  Specialty Comments:  No specialty comments available.  Imaging: No new imaging   PMFS History: Patient Active Problem List   Diagnosis Date Noted   Acute lateral meniscus tear of right knee 07/10/2022   Traumatic avulsion of patellofemoral ligament, right,  initial encounter 05/09/2022   Patellofemoral dysfunction of right knee 05/09/2022   Chondral loose body of right knee joint 05/09/2022   Past Medical History:  Diagnosis Date   Eczema    OCD (obsessive compulsive disorder)    Patellar disorder     Family History  Problem Relation Age of Onset   Mental illness Mother     Past Surgical History:  Procedure Laterality Date   FOREIGN BODY REMOVAL Right 07/10/2022   Procedure: FOREIGN BODY REMOVAL ADULT;  Surgeon: Leandrew Koyanagi, MD;  Location: [place];  Service: Orthopedics;  Laterality: Right;   KNEE ARTHROSCOPY WITH LATERAL RELEASE Right 07/10/2022   Procedure: LATERAL RELEASE;  Surgeon: Leandrew Koyanagi, MD;  Location: [place];  Service: Orthopedics;  Laterality: Right;   KNEE ARTHROSCOPY WITH MEDIAL PATELLAR FEMORAL LIGAMENT RECONSTRUCTION Right 07/10/2022   Procedure: RIGHT KNEE ARTHROSCOPY WITH MEDIAL PATELLAR FEMORAL LIGAMENT RECONSTRUCTION;  Surgeon: Leandrew Koyanagi, MD;  Location: [place];  Service: Orthopedics;  Laterality: Right;   WISDOM TOOTH EXTRACTION     Social History   Occupational History   Not on file  Tobacco Use   Smoking status: Former    Types: Cigarettes    Quit date: 07/04/2014    Years since quitting: 8.0  Smokeless tobacco: Never  Vaping Use   Vaping Use: Every day  Substance and Sexual Activity   Alcohol use: Yes    Alcohol/week: 7.0 standard drinks of alcohol    Types: 7 Cans of beer per week   Drug use: Yes    Types: Marijuana   Sexual activity: Yes

## 2022-08-08 ENCOUNTER — Ambulatory Visit: Payer: Medicaid Other | Admitting: Physical Therapy

## 2022-08-08 ENCOUNTER — Encounter: Payer: Self-pay | Admitting: Physical Therapy

## 2022-08-08 DIAGNOSIS — R600 Localized edema: Secondary | ICD-10-CM

## 2022-08-08 DIAGNOSIS — R2689 Other abnormalities of gait and mobility: Secondary | ICD-10-CM

## 2022-08-08 DIAGNOSIS — M25561 Pain in right knee: Secondary | ICD-10-CM

## 2022-08-08 DIAGNOSIS — M6281 Muscle weakness (generalized): Secondary | ICD-10-CM

## 2022-08-08 NOTE — Therapy (Signed)
OUTPATIENT PHYSICAL THERAPY TREATMENT NOTE   Patient Name: Tanner Figueroa MRN: [id number] DOB:[birth year], 27 y.o., male Today's Date: 08/08/2022  PCP: NA   REFERRING PROVIDER: [name], MD  END OF SESSION:   PT End of Session - 08/08/22 1024     Visit Number 5    Number of Visits 17    Date for PT Re-Evaluation 09/17/22    Authorization Type [facility] [employer]    Authorization Time Period MCD submitted on 1/10 awating approval    PT Start Time 1024   pt arrived late   PT Stop Time 1100    PT Time Calculation (min) 36 min                Past Medical History:  Diagnosis Date   Eczema    OCD (obsessive compulsive disorder)    Patellar disorder    Past Surgical History:  Procedure Laterality Date   FOREIGN BODY REMOVAL Right 07/10/2022   Procedure: FOREIGN BODY REMOVAL ADULT;  Surgeon: [name], MD;  Location: [facility];  Service: Orthopedics;  Laterality: Right;   KNEE ARTHROSCOPY WITH LATERAL RELEASE Right 07/10/2022   Procedure: LATERAL RELEASE;  Surgeon: [name], MD;  Location: [facility];  Service: Orthopedics;  Laterality: Right;   KNEE ARTHROSCOPY WITH MEDIAL PATELLAR FEMORAL LIGAMENT RECONSTRUCTION Right 07/10/2022   Procedure: RIGHT KNEE ARTHROSCOPY WITH MEDIAL PATELLAR FEMORAL LIGAMENT RECONSTRUCTION;  Surgeon: [name], MD;  Location:  SURGERY CENTER;  Service: Orthopedics;  Laterality: Right;   WISDOM TOOTH EXTRACTION     Patient Active Problem List   Diagnosis Date Noted   Acute lateral meniscus tear of right knee 07/10/2022   Traumatic avulsion of patellofemoral ligament, right, initial encounter 05/09/2022   Patellofemoral dysfunction of right knee 05/09/2022   Chondral loose body of right knee joint 05/09/2022    REFERRING DIAG:  Q46.962X (ICD-10-CM) - Traumatic avulsion of patellofemoral ligament, right, initial encounter  M25.861 (ICD-10-CM) - Patellofemoral dysfunction of right knee   S83.281A (ICD-10-CM) - Acute lateral meniscus tear of right knee, initial encounter    THERAPY DIAG:  Acute pain of right knee  Muscle weakness (generalized)  Other abnormalities of gait and mobility  Localized edema  Rationale for Evaluation and Treatment Rehabilitation  PERTINENT HISTORY: s/p R knee arthroscopy w/ partial lateral meniscectomy, patellar chondroplasty, MPFL reconstruction, and lateral release  PRECAUTIONS: WBAT with brace per Op Note; ROM with PSO brace per 07/17/22 physician notes, no lateral patellar mobilizations   SUBJECTIVE:  SUBJECTIVE STATEMENT:   "I am doing good no pain today. I saw Dr Erlinda Hong last session and he approved me to return to work."   PAIN:  Are you having pain? 08/08/2022  NPRS scale: 0- 9/10 at worst  Pain location: Right knee Pain description: Tight, pulling, sharp, aching Aggravating factors: Bending the knee, walking, sleeping on stomach, stairs Relieving factors: Rest, using the brace, medication   OBJECTIVE: (objective measures completed at initial evaluation unless otherwise dated)   DIAGNOSTIC FINDINGS:  Right knee MRI 03/25/2022: IMPRESSION: 1. Ruptured PCL. 2. Grade 1 sprains of the proximal fibular collateral ligament and distal popliteus tendon. 3. Contusions of the anterior medial tibial plateau and inferior patella. 4. 1.0 cm intra-articular body inferior to the posterior horn of the lateral meniscus, of unclear donor site. This appears separate from the meniscus. No meniscal tear. 5. 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 No marrow edema or surrounding soft tissue swelling to suggest acute  injury. 6. Moderate joint effusion.   PATIENT SURVEYS:  LEFS 25/80   COGNITION:           Overall cognitive status: Within functional limits for tasks assessed                          SENSATION: Grossly intact aside from subjective numbness + reduction in sensation medial/lateral/distal knee on R   EDEMA/OBSERVATION:  Circumferential: 47.5cm at patella R, 44cm on L 43cm at tibial tuberosity R, 42.5cm on L Incisions appear to be healing well, no overt erythema or drainage, appear WNL     PALPATION: Mild TTP lateral quad and TFL on R Gross tenderness throughout knee  Deferred patellar mobilizations given acuity of surgery   LOWER EXTREMITY ROM:   Active ROM Right eval Left eval  Knee flexion Seated, 85 deg 118  Knee extension Lacking 4 deg 0  Comments: Pt self-initiates bending of knee during subjective, quickly measured while in position, deferred further focal assessment of knee flexion given recency of surgery and available ROM   LOWER EXTREMITY MMT:   MMT Right eval Left eval  Hip flexion      Hip extension      Hip abduction      Knee flexion      Knee extension      Comments: NT given recency of surgery     LOWER EXTREMITY SPECIAL TESTS:  Not assessed   FUNCTIONAL TESTS:  Sit to stand transfer from standard chair: B UE support, R knee extended relative to L, weight shift to L   GAIT: Assistive device utilized: None Level of assistance: Complete Independence Comments: pt arrives without brace or AD; antalgic gait, reduced R knee ROM throughout all phases of gait, partial step through pattern      TODAY'S TREATMENT:  [facility] Adult PT Treatment:                                                DATE: 08/08/22 Therapeutic Exercise: SLR 1 x 15 - increased quad lag noted with fatigue Recumbent bike L1 x 4 min (seated at highest position) Seated calf stretch with belt 1 x 30 sec Seated calf stretch 1 x 30 sec  Mini  counter squats 2 x 10 - verbal cues for proper  foot/ knee positioning to reudce issue.  Updated HEP for stretch, standing hip strengthening.    [facility] Adult PT Treatment:                                                DATE: 08/06/22 Therapeutic Exercise: Supine TKE with heel prop x20 cues for pacing Standing knee flexion 2x12 cues for form and pacing  Heel raises 2x15 at counter cues for reduced compensations at trunk  SLR x5 cues for full extension, avoiding lag, and appropriate setup  [facility] Adult PT Treatment:                                                DATE: 08/02/22 Therapeutic Exercise: TKE quad set with heel prop 2x20  Standing hip 45 deg 2x8 each LE cues for reduced lean and glute squeeze Standing knee flexion 2x10 cues for form and comfortable ROM  Standing heel raises x20 cues for form and pacing Manual Therapy: Stm R quad in supine with bolster  Gentle PROM flexion/extension to pt tolerance R knee joint     PATIENT EDUCATION:  Education details: rationale for interventions, relevant anatomy/physiology, typical surgical healing and safety w/ activity Person educated: Patient Education method: Explanation, Demonstration, Tactile cues, Verbal cues, and Handouts Education comprehension: verbalized understanding, returned demonstration, verbal cues required, tactile cues required, and needs further education   HOME EXERCISE PROGRAM: Access Code: [email or web address] URL: [email or web address] Date: 08/08/2022 Prepared by: Starr Lake  Exercises - Supine Quad Set  - 1 x daily - 7 x weekly - 2 sets - 20 reps - Standing Knee Flexion AROM with Chair Support  - 1 x daily - 7 x weekly - 2 sets - 10 reps - Heel Raises with Counter Support  - 1 x daily - 7 x weekly - 2 sets - 15 reps - SLR (Mirrored)  - 1 x daily - 7 x weekly - 3 sets - 10 - 15 reps - 1 hold - Supine Bridge  - 1 x daily - 7 x weekly - 2 sets - 10 reps - Seated Calf Stretch with Strap  - 1 x daily - 7 x weekly - 2 sets - 10 reps - Standing Gastroc  Stretch  - 2 x daily - 7 x weekly - 2 sets - 2 reps - 30 hold - Seated Hamstring Stretch  - 1 x daily - 7 x weekly - 2 sets - 2 reps - 30 hold - Standing Hamstring Stretch on Counter  - 1 x daily - 7 x weekly - 2 sets - Mini Squat with Counter Support  - 1 x daily - 7 x weekly - 2 sets - 10 reps - Standing Hip Abduction with Counter Support  - 1 x daily - 7 x weekly - 3 sets - 15 reps   ASSESSMENT: CLINICAL IMPRESSION: Pt arrives to PT at his 4 week post-op. He saw his MD since his last PT visit and reports he has been released to return to work. Continued progression based in MPFL protocol progressing to week 4-5 with CKC strengthening. He did well with exercise but does demonstrated apprehension of pain which improved  after exercise started. He is progressing with strengtheing but does demonstrate a quad lag as a result of fatigue with SLR. Updated his HEP today for stretching and strengthening consistent with his protocol. End of session he reported no increase in pain or issues.      OBJECTIVE IMPAIRMENTS Abnormal gait, decreased activity tolerance, decreased balance, decreased mobility, difficulty walking, decreased ROM, decreased strength, increased edema, impaired flexibility, impaired sensation, improper body mechanics, and pain.    ACTIVITY LIMITATIONS carrying, lifting, bending, sitting, standing, squatting, sleeping, stairs, transfers, and locomotion level   PARTICIPATION LIMITATIONS: meal prep, cleaning, laundry, shopping, community activity, occupation, and yard work   PERSONAL FACTORS Past/current experiences, Profession, and Time since onset of injury/illness/exacerbation are also affecting patient's functional outcome.    REHAB POTENTIAL: Good   CLINICAL DECISION MAKING: Stable/uncomplicated   EVALUATION COMPLEXITY: Low     GOALS: Goals reviewed with patient? Yes SHORT TERM GOALS: Target date: 09/03/2022   1. Pt will demonstrate appropriate understanding and performance of  initially prescribed HEP in order to facilitate improved independence with management of symptoms.  Baseline: HEP provided on eval Goal status: INITIAL    2. Pt will score greater than or equal to 35/80 on LEFS in order to demonstrate improved perception of function due to symptoms.            Baseline: 25            Goal status: INITIAL               LONG TERM GOALS: Target date: 10/15/2022     Patient will be I with final HEP to maintain progress from PT. Baseline: HEP provided at eval Goal status: INITIAL   2.  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  Patient will demonstrate right extension strength 5/5 MMT in order to avoid knee giving way with walking or squatting tasks Baseline: deferred given acuity of surgery Goal status: INITIAL   4.  Patient will report pain level </= 3/10 with standing and walking greater than 1hr to improve ability to perform work related tasks Baseline: limited d/t pain/swelling, pain 0-9/10 Goal status: INITIAL   5. Pt will score > or equal to 50/80 on LEFS in order to indicate improved perception of function due to pain.             Baseline: 25/80            Goal status: INITIAL     PLAN: PT FREQUENCY: 2x/week   PT DURATION:  12 weeks    PLANNED INTERVENTIONS: Therapeutic exercises, Therapeutic activity, Neuromuscular re-education, Balance training, Gait training, Patient/Family education, Self Care, Joint mobilization, Stair training, DME instructions, Aquatic Therapy, Dry Needling, Electrical stimulation, Cryotherapy, Moist heat, scar mobilization, Taping, Vasopneumatic device, Manual therapy, and Re-evaluation   PLAN FOR NEXT SESSION:  progress ROM/gait within precautions as able/tolerated. Emphasis on quad activation. HEP update + handout.     Hannelore Bova PT, DPT, LAT, ATC  08/08/22  12:07  PM

## 2022-08-12 ENCOUNTER — Telehealth: Payer: Self-pay | Admitting: Physical Therapy

## 2022-08-12 NOTE — Telephone Encounter (Signed)
Spoke with pt regarding tomorrow's appointment, unfortunately have to cancel due to authorization issues. Confirmed Friday appointment, pt verbalizes understanding/agreement

## 2022-08-13 ENCOUNTER — Ambulatory Visit: Payer: Medicaid Other | Admitting: Physical Therapy

## 2022-08-14 NOTE — Therapy (Incomplete)
OUTPATIENT PHYSICAL THERAPY TREATMENT NOTE   Patient Name: Tanner Figueroa MRN: [id number] DOB:04-06-1996, 27 y.o., male Today's Date: 08/14/2022  PCP: NA   REFERRING PROVIDER: [name], Figueroa  END OF SESSION:        Past Medical History:  Diagnosis Date   Eczema    OCD (obsessive compulsive disorder)    Patellar disorder    Past Surgical History:  Procedure Laterality Date   FOREIGN BODY REMOVAL Right 07/10/2022   Procedure: FOREIGN BODY REMOVAL ADULT;  Surgeon: Tanner Figueroa;  Location: [facility];  Service: Orthopedics;  Laterality: Right;   KNEE ARTHROSCOPY WITH LATERAL RELEASE Right 07/10/2022   Procedure: LATERAL RELEASE;  Surgeon: Tanner Figueroa;  Location: [facility];  Service: Orthopedics;  Laterality: Right;   KNEE ARTHROSCOPY WITH MEDIAL PATELLAR FEMORAL LIGAMENT RECONSTRUCTION Right 07/10/2022   Procedure: RIGHT KNEE ARTHROSCOPY WITH MEDIAL PATELLAR FEMORAL LIGAMENT RECONSTRUCTION;  Surgeon: Tanner Figueroa;  Location: [facility];  Service: Orthopedics;  Laterality: Right;   WISDOM TOOTH EXTRACTION     Patient Active Problem List   Diagnosis Date Noted   Acute lateral meniscus tear of right knee 07/10/2022   Traumatic avulsion of patellofemoral ligament, right, initial encounter 05/09/2022   Patellofemoral dysfunction of right knee 05/09/2022   Chondral loose body of right knee joint 05/09/2022    REFERRING DIAG:  N82.956O (ICD-10-CM) - Traumatic avulsion of patellofemoral ligament, right, initial encounter  M25.861 (ICD-10-CM) - Patellofemoral dysfunction of right knee  S83.281A (ICD-10-CM) - Acute lateral meniscus tear of right knee, initial encounter    THERAPY DIAG:  No diagnosis found.  Rationale for Evaluation and Treatment Rehabilitation  PERTINENT HISTORY: s/p R knee arthroscopy w/ partial lateral meniscectomy, patellar chondroplasty, MPFL reconstruction, and lateral  release  PRECAUTIONS: WBAT with brace per Op Note; ROM with PSO brace per 07/17/22 physician notes, no lateral patellar mobilizations   SUBJECTIVE:                                                                                                                                                                                      SUBJECTIVE STATEMENT:   ***  *** "I am doing good no pain today. I saw Tanner Figueroa last session and he approved me to return to work."   PAIN:  Are you having pain? 08/14/2022  NPRS scale: 0- 9/10 at worst  Pain location: Right knee Pain description: Tight, pulling, sharp, aching Aggravating factors: Bending the knee, walking, sleeping on stomach, stairs Relieving factors: Rest, using the brace, medication   OBJECTIVE: (objective measures completed at initial evaluation unless otherwise dated)  DIAGNOSTIC FINDINGS:  Right knee MRI 03/25/2022: IMPRESSION: 1. Ruptured PCL. 2. Grade 1 sprains of the proximal fibular collateral ligament and distal popliteus tendon. 3. Contusions of the anterior medial tibial plateau and inferior patella. 4. 1.0 cm intra-articular body inferior to the posterior horn of the lateral meniscus, of unclear donor site. This appears separate from the meniscus. No meniscal tear. 5. 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 No marrow edema or surrounding soft tissue swelling to suggest acute injury. 6. Moderate joint effusion.   PATIENT SURVEYS:  LEFS 25/80   COGNITION:           Overall cognitive status: Within functional limits for tasks assessed                          SENSATION: Grossly intact aside from subjective numbness + reduction in sensation medial/lateral/distal knee on R   EDEMA/OBSERVATION:  Circumferential: 47.5cm at patella R, 44cm on L 43cm at tibial tuberosity R,  42.5cm on L Incisions appear to be healing well, no overt erythema or drainage, appear WNL     PALPATION: Mild TTP lateral quad and TFL on R Gross tenderness throughout knee  Deferred patellar mobilizations given acuity of surgery   LOWER EXTREMITY ROM:   Active ROM Right eval Left eval  Knee flexion Seated, 85 deg 118  Knee extension Lacking 4 deg 0  Comments: Pt self-initiates bending of knee during subjective, quickly measured while in position, deferred further focal assessment of knee flexion given recency of surgery and available ROM   LOWER EXTREMITY MMT:   MMT Right eval Left eval  Hip flexion      Hip extension      Hip abduction      Knee flexion      Knee extension      Comments: NT given recency of surgery     LOWER EXTREMITY SPECIAL TESTS:  Not assessed   FUNCTIONAL TESTS:  Sit to stand transfer from standard chair: B UE support, R knee extended relative to L, weight shift to L   GAIT: Assistive device utilized: None Level of assistance: Complete Independence Comments: pt arrives without brace or AD; antalgic gait, reduced R knee ROM throughout all phases of gait, partial step through pattern      TODAY'S TREATMENT: [name] Adult PT Treatment:                                                DATE: 08/16/22  Therapeutic Exercise: *** Manual Therapy: *** Neuromuscular re-ed: *** Therapeutic Activity: *** Modalities: *** Self Care: ***   [name] Adult PT Treatment:                                                DATE: 08/08/22 Therapeutic Exercise: SLR 1 x 15 - increased quad lag noted with fatigue Recumbent bike L1 x 4 min (seated at highest position) Seated calf stretch with belt 1 x 30 sec Seated calf stretch 1 x 30 sec  Mini counter squats 2 x 10 - verbal cues for proper foot/  knee positioning to reudce issue.  Updated HEP for stretch, standing hip strengthening.    [name] Adult PT Treatment:                                                DATE:  08/06/22 Therapeutic Exercise: Supine TKE with heel prop x20 cues for pacing Standing knee flexion 2x12 cues for form and pacing  Heel raises 2x15 at counter cues for reduced compensations at trunk  SLR x5 cues for full extension, avoiding lag, and appropriate setup     PATIENT EDUCATION:  Education details: rationale for interventions, relevant anatomy/physiology, typical surgical healing and safety w/ activity Person educated: Patient Education method: Explanation, Demonstration, Tactile cues, Verbal cues, and Handouts Education comprehension: verbalized understanding, returned demonstration, verbal cues required, tactile cues required, and needs further education   HOME EXERCISE PROGRAM: Access Code: [email or web address] URL: [email or web address] Date: 08/08/2022 Prepared by: Lulu Riding  Exercises - Supine Quad Set  - 1 x daily - 7 x weekly - 2 sets - 20 reps - Standing Knee Flexion AROM with Chair Support  - 1 x daily - 7 x weekly - 2 sets - 10 reps - Heel Raises with Counter Support  - 1 x daily - 7 x weekly - 2 sets - 15 reps - SLR (Mirrored)  - 1 x daily - 7 x weekly - 3 sets - 10 - 15 reps - 1 hold - Supine Bridge  - 1 x daily - 7 x weekly - 2 sets - 10 reps - Seated Calf Stretch with Strap  - 1 x daily - 7 x weekly - 2 sets - 10 reps - Standing Gastroc Stretch  - 2 x daily - 7 x weekly - 2 sets - 2 reps - 30 hold - Seated Hamstring Stretch  - 1 x daily - 7 x weekly - 2 sets - 2 reps - 30 hold - Standing Hamstring Stretch on Counter  - 1 x daily - 7 x weekly - 2 sets - Mini Squat with Counter Support  - 1 x daily - 7 x weekly - 2 sets - 10 reps - Standing Hip Abduction with Counter Support  - 1 x daily - 7 x weekly - 3 sets - 15 reps   ASSESSMENT: CLINICAL IMPRESSION: ***  *** Pt arrives to PT at his 4 week post-op. He saw his Figueroa since his last PT visit and reports he has been released to return to work. Continued progression based in MPFL protocol progressing to  week 4-5 with CKC strengthening. He did well with exercise but does demonstrated apprehension of pain which improved after exercise started. He is progressing with strengtheing but does demonstrate a quad lag as a result of fatigue with SLR. Updated his HEP today for stretching and strengthening consistent with his protocol. End of session he reported no increase in pain or issues.      OBJECTIVE IMPAIRMENTS Abnormal gait, decreased activity tolerance, decreased balance, decreased mobility, difficulty walking, decreased ROM, decreased strength, increased edema, impaired flexibility, impaired sensation, improper body mechanics, and pain.    ACTIVITY LIMITATIONS carrying, lifting, bending, sitting, standing, squatting, sleeping, stairs, transfers, and locomotion level   PARTICIPATION LIMITATIONS: meal prep, cleaning, laundry, shopping, community activity, occupation, and yard work   PERSONAL FACTORS Past/current experiences, Profession, and Time since onset of injury/illness/exacerbation are also affecting  patient's functional outcome.    REHAB POTENTIAL: Good   CLINICAL DECISION MAKING: Stable/uncomplicated   EVALUATION COMPLEXITY: Low     GOALS: Goals reviewed with patient? Yes SHORT TERM GOALS: Target date: 09/03/2022   1. Pt will demonstrate appropriate understanding and performance of initially prescribed HEP in order to facilitate improved independence with management of symptoms.  Baseline: HEP provided on eval Goal status: INITIAL    2. Pt will score greater than or equal to 35/80 on LEFS in order to demonstrate improved perception of function due to symptoms.            Baseline: 25            Goal status: INITIAL               LONG TERM GOALS: Target date: 10/15/2022     Patient will be I with final HEP to maintain progress from PT. Baseline: HEP provided at eval Goal status: INITIAL   2.  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  Patient will demonstrate right extension strength 5/5 MMT in order to avoid knee giving way with walking or squatting tasks Baseline: deferred given acuity of surgery Goal status: INITIAL   4.  Patient will report pain level </= 3/10 with standing and walking greater than 1hr to improve ability to perform work related tasks Baseline: limited d/t pain/swelling, pain 0-9/10 Goal status: INITIAL   5. Pt will score > or equal to 50/80 on LEFS in order to indicate improved perception of function due to pain.             Baseline: 25/80            Goal status: INITIAL     PLAN: PT FREQUENCY: 2x/week   PT DURATION:  12 weeks    PLANNED INTERVENTIONS: Therapeutic exercises, Therapeutic activity, Neuromuscular re-education, Balance training, Gait training, Patient/Family education, Self Care, Joint mobilization, Stair training, DME instructions, Aquatic Therapy, Dry Needling, Electrical stimulation, Cryotherapy, Moist heat, scar mobilization, Taping, Vasopneumatic device, Manual therapy, and Re-evaluation   PLAN FOR NEXT SESSION:  progress ROM/gait within precautions as able/tolerated. Emphasis on quad activation. HEP update + handout.     Leeroy Cha PT, DPT 08/14/2022 1:13 PM

## 2022-08-16 ENCOUNTER — Ambulatory Visit: Payer: Medicaid Other | Admitting: Physical Therapy

## 2022-08-19 NOTE — Therapy (Signed)
OUTPATIENT PHYSICAL THERAPY TREATMENT NOTE   Patient Name: Tanner Figueroa MRN: [id number] DOB:05-16-1996, 27 y.o., male Today's Date: 08/20/2022  PCP: NA   REFERRING PROVIDER: Leandrew Koyanagi, MD  END OF SESSION:   PT End of Session - 08/20/22 1023     Visit Number 6    Number of Visits 17    Date for PT Re-Evaluation 09/17/22    Authorization Type [place] [facility]    Authorization Time Period 08/14/22-09/24/22    Authorization - Visit Number 1    Authorization - Number of Visits 12    PT Start Time 1024   late check in   PT Stop Time 1057    PT Time Calculation (min) 33 min    Activity Tolerance Patient tolerated treatment well;No increased pain    Behavior During Therapy WFL for tasks assessed/performed                 Past Medical History:  Diagnosis Date   Eczema    OCD (obsessive compulsive disorder)    Patellar disorder    Past Surgical History:  Procedure Laterality Date   FOREIGN BODY REMOVAL Right 07/10/2022   Procedure: FOREIGN BODY REMOVAL ADULT;  Surgeon: Leandrew Koyanagi, MD;  Location: [place];  Service: Orthopedics;  Laterality: Right;   KNEE ARTHROSCOPY WITH LATERAL RELEASE Right 07/10/2022   Procedure: LATERAL RELEASE;  Surgeon: Leandrew Koyanagi, MD;  Location: [place];  Service: Orthopedics;  Laterality: Right;   KNEE ARTHROSCOPY WITH MEDIAL PATELLAR FEMORAL LIGAMENT RECONSTRUCTION Right 07/10/2022   Procedure: RIGHT KNEE ARTHROSCOPY WITH MEDIAL PATELLAR FEMORAL LIGAMENT RECONSTRUCTION;  Surgeon: Leandrew Koyanagi, MD;  Location: [place];  Service: Orthopedics;  Laterality: Right;   WISDOM TOOTH EXTRACTION     Patient Active Problem List   Diagnosis Date Noted   Acute lateral meniscus tear of right knee 07/10/2022   Traumatic avulsion of patellofemoral ligament, right, initial encounter 05/09/2022   Patellofemoral dysfunction of right knee 05/09/2022   Chondral loose body of right knee joint 05/09/2022     REFERRING DIAG:  H84.696E (ICD-10-CM) - Traumatic avulsion of patellofemoral ligament, right, initial encounter  M25.861 (ICD-10-CM) - Patellofemoral dysfunction of right knee  S83.281A (ICD-10-CM) - Acute lateral meniscus tear of right knee, initial encounter    THERAPY DIAG:  Acute pain of right knee  Muscle weakness (generalized)  Other abnormalities of gait and mobility  Localized edema  Rationale for Evaluation and Treatment Rehabilitation  PERTINENT HISTORY: s/p R knee arthroscopy w/ partial lateral meniscectomy, patellar chondroplasty, MPFL reconstruction, and lateral release  PRECAUTIONS: WBAT with brace per Op Note; ROM with PSO brace per 07/17/22 physician notes, no lateral patellar mobilizations   SUBJECTIVE:  SUBJECTIVE STATEMENT:   Pt states he has been doing well overall, mild soreness after last session that resolved well. Has been back at work, primary complaint of fatigue. Pt reports limited adherence with HEP due to return to work    PAIN:  Are you having pain? 08/20/2022  NPRS scale: 0- 7/10 at worst (up to 9/10 on eval) Pain location: Right knee Pain description: Tight, pulling, sharp, aching Aggravating factors: Bending the knee, walking, sleeping on stomach, stairs Relieving factors: Rest, using the brace, medication   OBJECTIVE: (objective measures completed at initial evaluation unless otherwise dated)   DIAGNOSTIC FINDINGS:  Right knee MRI 03/25/2022: IMPRESSION: 1. Ruptured PCL. 2. Grade 1 sprains of the proximal fibular collateral ligament and distal popliteus tendon. 3. Contusions of the anterior medial tibial plateau and inferior patella. 4. 1.0 cm intra-articular body inferior to the posterior horn of the lateral meniscus, of unclear donor site. This appears  separate from the meniscus. No meniscal tear. 5. 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 No marrow edema or surrounding soft tissue swelling to suggest acute injury. 6. Moderate joint effusion.   PATIENT SURVEYS:  LEFS 25/80   COGNITION:           Overall cognitive status: Within functional limits for tasks assessed                          SENSATION: Grossly intact aside from subjective numbness + reduction in sensation medial/lateral/distal knee on R   EDEMA/OBSERVATION:  Circumferential: 47.5cm at patella R, 44cm on L 43cm at tibial tuberosity R, 42.5cm on L Incisions appear to be healing well, no overt erythema or drainage, appear WNL     PALPATION: Mild TTP lateral quad and TFL on R Gross tenderness throughout knee  Deferred patellar mobilizations given acuity of surgery   LOWER EXTREMITY ROM:   Active ROM Right eval Left eval  Knee flexion Seated, 85 deg 118  Knee extension Lacking 4 deg 0  Comments: Pt self-initiates bending of knee during subjective, quickly measured while in position, deferred further focal assessment of knee flexion given recency of surgery and available ROM   LOWER EXTREMITY MMT:   MMT Right eval Left eval  Hip flexion      Hip extension      Hip abduction      Knee flexion      Knee extension      Comments: NT given recency of surgery     LOWER EXTREMITY SPECIAL TESTS:  Not assessed   FUNCTIONAL TESTS:  Sit to stand transfer from standard chair: B UE support, R knee extended relative to L, weight shift to L   GAIT: Assistive device utilized: None Level of assistance: Complete Independence Comments: pt arrives without brace or AD; antalgic gait, reduced R knee ROM throughout all phases of gait, partial step through pattern      TODAY'S TREATMENT: [facility] Adult PT Treatment:                                                 DATE: 08/20/22  Therapeutic Exercise: Recumbent bike for ROM at max seat height SLR x5 super set with x5 STS from raised  mat, 4 sets of each cues for mechanics and appropriate quad contraction Standing hip abduction 3x12 each LE, cues for min knee bend for quad/HS co contraction Knee flexion AROM 2x12 each LE  4inch step weight shift w UE support 2x10 cues for appropriate setup w/ ROM  HEP review    [name] Adult PT Treatment:                                                DATE: 08/08/22 Therapeutic Exercise: SLR 1 x 15 - increased quad lag noted with fatigue Recumbent bike L1 x 4 min (seated at highest position) Seated calf stretch with belt 1 x 30 sec Seated calf stretch 1 x 30 sec  Mini counter squats 2 x 10 - verbal cues for proper foot/ knee positioning to reudce issue.  Updated HEP for stretch, standing hip strengthening.    [name] Adult PT Treatment:                                                DATE: 08/06/22 Therapeutic Exercise: Supine TKE with heel prop x20 cues for pacing Standing knee flexion 2x12 cues for form and pacing  Heel raises 2x15 at counter cues for reduced compensations at trunk  SLR x5 cues for full extension, avoiding lag, and appropriate setup     PATIENT EDUCATION:  Education details: rationale for interventions, relevant anatomy/physiology, typical surgical healing and safety w/ activity Person educated: Patient Education method: Explanation, Demonstration, Tactile cues, Verbal cues, and Handouts Education comprehension: verbalized understanding, returned demonstration, verbal cues required, tactile cues required, and needs further education   HOME EXERCISE PROGRAM: Access Code: [email or web address] URL: [email or web address] Date: 08/08/2022 Prepared by: Lulu Riding  Exercises - Supine Quad Set  - 1 x daily - 7 x weekly - 2 sets - 20 reps - Standing Knee Flexion AROM with Chair Support  - 1 x  daily - 7 x weekly - 2 sets - 10 reps - Heel Raises with Counter Support  - 1 x daily - 7 x weekly - 2 sets - 15 reps - SLR (Mirrored)  - 1 x daily - 7 x weekly - 3 sets - 10 - 15 reps - 1 hold - Supine Bridge  - 1 x daily - 7 x weekly - 2 sets - 10 reps - Seated Calf Stretch with Strap  - 1 x daily - 7 x weekly - 2 sets - 10 reps - Standing Gastroc Stretch  - 2 x daily - 7 x weekly - 2 sets - 2 reps - 30 hold - Seated Hamstring Stretch  - 1 x daily - 7 x weekly - 2 sets - 2 reps - 30 hold - Standing Hamstring Stretch on Counter  - 1 x daily - 7 x weekly - 2 sets - Mini Squat with Counter Support  - 1 x daily - 7 x weekly - 2 sets - 10 reps - Standing Hip Abduction with Counter Support  - 1 x daily - 7 x weekly - 3 sets - 15 reps   ASSESSMENT: CLINICAL IMPRESSION: Pt arrives nearly 6 weeks post op, denies any significant issues, primary complaint of fatigue with return to work. Today focusing on appropriate progression  of open and closed chain LE strengthening within protocol, pt tolerates well without adverse events. Cues as above. Departs with report of improved symptoms. Pt departs today's session in no acute distress, all voiced questions/concerns addressed appropriately from PT perspective.       OBJECTIVE IMPAIRMENTS Abnormal gait, decreased activity tolerance, decreased balance, decreased mobility, difficulty walking, decreased ROM, decreased strength, increased edema, impaired flexibility, impaired sensation, improper body mechanics, and pain.    ACTIVITY LIMITATIONS carrying, lifting, bending, sitting, standing, squatting, sleeping, stairs, transfers, and locomotion level   PARTICIPATION LIMITATIONS: meal prep, cleaning, laundry, shopping, community activity, occupation, and yard work   PERSONAL FACTORS Past/current experiences, Profession, and Time since onset of injury/illness/exacerbation are also affecting patient's functional outcome.    REHAB POTENTIAL: Good   CLINICAL  DECISION MAKING: Stable/uncomplicated   EVALUATION COMPLEXITY: Low     GOALS: Goals reviewed with patient? Yes SHORT TERM GOALS: Target date: 09/03/2022   1. Pt will demonstrate appropriate understanding and performance of initially prescribed HEP in order to facilitate improved independence with management of symptoms.  Baseline: HEP provided on eval Goal status: INITIAL    2. Pt will score greater than or equal to 35/80 on LEFS in order to demonstrate improved perception of function due to symptoms.            Baseline: 25            Goal status: INITIAL               LONG TERM GOALS: Target date: 10/15/2022     Patient will be I with final HEP to maintain progress from PT. Baseline: HEP provided at eval Goal status: INITIAL   2.  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  Patient will demonstrate right extension strength 5/5 MMT in order to avoid knee giving way with walking or squatting tasks Baseline: deferred given acuity of surgery Goal status: INITIAL   4.  Patient will report pain level </= 3/10 with standing and walking greater than 1hr to improve ability to perform work related tasks Baseline: limited d/t pain/swelling, pain 0-9/10 Goal status: INITIAL   5. Pt will score > or equal to 50/80 on LEFS in order to indicate improved perception of function due to pain.             Baseline: 25/80            Goal status: INITIAL     PLAN: PT FREQUENCY: 2x/week   PT DURATION:  12 weeks    PLANNED INTERVENTIONS: Therapeutic exercises, Therapeutic activity, Neuromuscular re-education, Balance training, Gait training, Patient/Family education, Self Care, Joint mobilization, Stair training, DME instructions, Aquatic Therapy, Dry Needling, Electrical stimulation, Cryotherapy, Moist heat, scar mobilization, Taping, Vasopneumatic device,  Manual therapy, and Re-evaluation   PLAN FOR NEXT SESSION:  progress ROM/gait within precautions as able/tolerated. Emphasis on quad activation. HEP update + handout.     Leeroy Cha PT, DPT 08/20/2022 12:42 PM

## 2022-08-20 ENCOUNTER — Ambulatory Visit: Payer: Medicaid Other | Admitting: Physical Therapy

## 2022-08-20 ENCOUNTER — Encounter: Payer: Self-pay | Admitting: Physical Therapy

## 2022-08-20 DIAGNOSIS — R2689 Other abnormalities of gait and mobility: Secondary | ICD-10-CM

## 2022-08-20 DIAGNOSIS — R600 Localized edema: Secondary | ICD-10-CM

## 2022-08-20 DIAGNOSIS — M6281 Muscle weakness (generalized): Secondary | ICD-10-CM

## 2022-08-20 DIAGNOSIS — M25561 Pain in right knee: Secondary | ICD-10-CM | POA: Diagnosis not present

## 2022-08-21 NOTE — Therapy (Incomplete)
OUTPATIENT PHYSICAL THERAPY TREATMENT NOTE   Patient Name: Tanner Figueroa MRN: [id number] DOB:17-Mar-1996, 27 y.o., male Today's Date: 08/21/2022  PCP: NA   REFERRING PROVIDER: [name], MD  END OF SESSION:         Past Medical History:  Diagnosis Date   Eczema    OCD (obsessive compulsive disorder)    Patellar disorder    Past Surgical History:  Procedure Laterality Date   FOREIGN BODY REMOVAL Right 07/10/2022   Procedure: FOREIGN BODY REMOVAL ADULT;  Surgeon: [name], MD;  Location: [facility];  Service: Orthopedics;  Laterality: Right;   KNEE ARTHROSCOPY WITH LATERAL RELEASE Right 07/10/2022   Procedure: LATERAL RELEASE;  Surgeon: [name], MD;  Location: [facility];  Service: Orthopedics;  Laterality: Right;   KNEE ARTHROSCOPY WITH MEDIAL PATELLAR FEMORAL LIGAMENT RECONSTRUCTION Right 07/10/2022   Procedure: RIGHT KNEE ARTHROSCOPY WITH MEDIAL PATELLAR FEMORAL LIGAMENT RECONSTRUCTION;  Surgeon: [name], MD;  Location: [facility];  Service: Orthopedics;  Laterality: Right;   WISDOM TOOTH EXTRACTION     Patient Active Problem List   Diagnosis Date Noted   Acute lateral meniscus tear of right knee 07/10/2022   Traumatic avulsion of patellofemoral ligament, right, initial encounter 05/09/2022   Patellofemoral dysfunction of right knee 05/09/2022   Chondral loose body of right knee joint 05/09/2022    REFERRING DIAG:  Q03.474Q (ICD-10-CM) - Traumatic avulsion of patellofemoral ligament, right, initial encounter  M25.861 (ICD-10-CM) - Patellofemoral dysfunction of right knee  S83.281A (ICD-10-CM) - Acute lateral meniscus tear of right knee, initial encounter    THERAPY DIAG:  No diagnosis found.  Rationale for Evaluation and Treatment Rehabilitation  PERTINENT HISTORY: s/p R knee arthroscopy w/ partial lateral meniscectomy, patellar chondroplasty, MPFL reconstruction, and lateral  release  PRECAUTIONS: WBAT with brace per Op Note; ROM with PSO brace per 07/17/22 physician notes, no lateral patellar mobilizations   SUBJECTIVE:                                                                                                                                                                                      SUBJECTIVE STATEMENT:   ***  *** Pt states he has been doing well overall, mild soreness after last session that resolved well. Has been back at work, primary complaint of fatigue. Pt reports limited adherence with HEP due to return to work    PAIN:  Are you having pain? 08/21/2022  NPRS scale: 0- 7/10 at worst (up to 9/10 on eval) Pain location: Right knee Pain description: Tight, pulling, sharp, aching Aggravating factors: Bending the knee, walking, sleeping on stomach,  stairs Relieving factors: Rest, using the brace, medication   OBJECTIVE: (objective measures completed at initial evaluation unless otherwise dated)   DIAGNOSTIC FINDINGS:  Right knee MRI 03/25/2022: IMPRESSION: 1. Ruptured PCL. 2. Grade 1 sprains of the proximal fibular collateral ligament and distal popliteus tendon. 3. Contusions of the anterior medial tibial plateau and inferior patella. 4. 1.0 cm intra-articular body inferior to the posterior horn of the lateral meniscus, of unclear donor site. This appears separate from the meniscus. No meniscal tear. 5. 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 No marrow edema or surrounding soft tissue swelling to suggest acute injury. 6. Moderate joint effusion.   PATIENT SURVEYS:  LEFS 25/80   COGNITION:           Overall cognitive status: Within functional limits for tasks assessed                          SENSATION: Grossly intact aside from subjective numbness + reduction in sensation  medial/lateral/distal knee on R   EDEMA/OBSERVATION:  Circumferential: 47.5cm at patella R, 44cm on L 43cm at tibial tuberosity R, 42.5cm on L Incisions appear to be healing well, no overt erythema or drainage, appear WNL     PALPATION: Mild TTP lateral quad and TFL on R Gross tenderness throughout knee  Deferred patellar mobilizations given acuity of surgery   LOWER EXTREMITY ROM:   Active ROM Right eval Left eval  Knee flexion Seated, 85 deg 118  Knee extension Lacking 4 deg 0  Comments: Pt self-initiates bending of knee during subjective, quickly measured while in position, deferred further focal assessment of knee flexion given recency of surgery and available ROM   LOWER EXTREMITY MMT:   MMT Right eval Left eval  Hip flexion      Hip extension      Hip abduction      Knee flexion      Knee extension      Comments: NT given recency of surgery     LOWER EXTREMITY SPECIAL TESTS:  Not assessed   FUNCTIONAL TESTS:  Sit to stand transfer from standard chair: B UE support, R knee extended relative to L, weight shift to L   GAIT: Assistive device utilized: None Level of assistance: Complete Independence Comments: pt arrives without brace or AD; antalgic gait, reduced R knee ROM throughout all phases of gait, partial step through pattern      TODAY'S TREATMENT: [facility] Adult PT Treatment:                                                DATE: 08/22/22 Therapeutic Exercise: *** Manual Therapy: *** Neuromuscular re-ed: *** Therapeutic Activity: *** Modalities: *** Self Care: ***   [facility] Adult PT Treatment:                                                DATE: 08/20/22  Therapeutic Exercise: Recumbent bike for ROM at max seat height SLR x5 super set with x5 STS from raised mat, 4 sets of each cues for mechanics and appropriate  quad contraction Standing hip abduction 3x12 each LE, cues for min knee bend for quad/HS co contraction Knee flexion AROM 2x12 each LE   4inch step weight shift w UE support 2x10 cues for appropriate setup w/ ROM  HEP review    [name] Adult PT Treatment:                                                DATE: 08/08/22 Therapeutic Exercise: SLR 1 x 15 - increased quad lag noted with fatigue Recumbent bike L1 x 4 min (seated at highest position) Seated calf stretch with belt 1 x 30 sec Seated calf stretch 1 x 30 sec  Mini counter squats 2 x 10 - verbal cues for proper foot/ knee positioning to reudce issue.  Updated HEP for stretch, standing hip strengthening.      PATIENT EDUCATION:  Education details: rationale for interventions, relevant anatomy/physiology, typical surgical healing and safety w/ activity *** Person educated: Patient Education method: Explanation, Demonstration, Tactile cues, Verbal cues, and Handouts Education comprehension: verbalized understanding, returned demonstration, verbal cues required, tactile cues required, and needs further education   HOME EXERCISE PROGRAM: Access Code: [email or web address] URL: [email or web address] Date: 08/08/2022 Prepared by: Starr Lake  Exercises - Supine Quad Set  - 1 x daily - 7 x weekly - 2 sets - 20 reps - Standing Knee Flexion AROM with Chair Support  - 1 x daily - 7 x weekly - 2 sets - 10 reps - Heel Raises with Counter Support  - 1 x daily - 7 x weekly - 2 sets - 15 reps - SLR (Mirrored)  - 1 x daily - 7 x weekly - 3 sets - 10 - 15 reps - 1 hold - Supine Bridge  - 1 x daily - 7 x weekly - 2 sets - 10 reps - Seated Calf Stretch with Strap  - 1 x daily - 7 x weekly - 2 sets - 10 reps - Standing Gastroc Stretch  - 2 x daily - 7 x weekly - 2 sets - 2 reps - 30 hold - Seated Hamstring Stretch  - 1 x daily - 7 x weekly - 2 sets - 2 reps - 30 hold - Standing Hamstring Stretch on Counter  - 1 x daily - 7 x weekly - 2 sets - Mini Squat with Counter Support  - 1 x daily - 7 x weekly - 2 sets - 10 reps - Standing Hip Abduction with Counter Support  - 1 x  daily - 7 x weekly - 3 sets - 15 reps   ASSESSMENT: CLINICAL IMPRESSION: ***  *** Pt arrives nearly 6 weeks post op, denies any significant issues, primary complaint of fatigue with return to work. Today focusing on appropriate progression of open and closed chain LE strengthening within protocol, pt tolerates well without adverse events. Cues as above. Departs with report of improved symptoms. Pt departs today's session in no acute distress, all voiced questions/concerns addressed appropriately from PT perspective.       OBJECTIVE IMPAIRMENTS Abnormal gait, decreased activity tolerance, decreased balance, decreased mobility, difficulty walking, decreased ROM, decreased strength, increased edema, impaired flexibility, impaired sensation, improper body mechanics, and pain.    ACTIVITY LIMITATIONS carrying, lifting, bending, sitting, standing, squatting, sleeping, stairs, transfers, and locomotion level   PARTICIPATION LIMITATIONS: meal prep, cleaning, laundry, shopping, community activity, occupation, and  yard work   PERSONAL FACTORS Past/current experiences, Profession, and Time since onset of injury/illness/exacerbation are also affecting patient's functional outcome.    REHAB POTENTIAL: Good   CLINICAL DECISION MAKING: Stable/uncomplicated   EVALUATION COMPLEXITY: Low     GOALS: Goals reviewed with patient? Yes SHORT TERM GOALS: Target date: 09/03/2022   1. Pt will demonstrate appropriate understanding and performance of initially prescribed HEP in order to facilitate improved independence with management of symptoms.  Baseline: HEP provided on eval Goal status: INITIAL    2. Pt will score greater than or equal to 35/80 on LEFS in order to demonstrate improved perception of function due to symptoms.            Baseline: 25            Goal status: INITIAL               LONG TERM GOALS: Target date: 10/15/2022     Patient will be I with final HEP to maintain progress from  PT. Baseline: HEP provided at eval Goal status: INITIAL   2.  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  Patient will demonstrate right extension strength 5/5 MMT in order to avoid knee giving way with walking or squatting tasks Baseline: deferred given acuity of surgery Goal status: INITIAL   4.  Patient will report pain level </= 3/10 with standing and walking greater than 1hr to improve ability to perform work related tasks Baseline: limited d/t pain/swelling, pain 0-9/10 Goal status: INITIAL   5. Pt will score > or equal to 50/80 on LEFS in order to indicate improved perception of function due to pain.             Baseline: 25/80            Goal status: INITIAL     PLAN: PT FREQUENCY: 2x/week   PT DURATION:  12 weeks    PLANNED INTERVENTIONS: Therapeutic exercises, Therapeutic activity, Neuromuscular re-education, Balance training, Gait training, Patient/Family education, Self Care, Joint mobilization, Stair training, DME instructions, Aquatic Therapy, Dry Needling, Electrical stimulation, Cryotherapy, Moist heat, scar mobilization, Taping, Vasopneumatic device, Manual therapy, and Re-evaluation   PLAN FOR NEXT SESSION:  progress ROM/gait within precautions as able/tolerated. Emphasis on quad activation. HEP update + handout.     Leeroy Cha PT, DPT 08/21/2022 8:49 AM

## 2022-08-22 ENCOUNTER — Ambulatory Visit: Payer: Medicaid Other | Admitting: Physical Therapy

## 2022-08-26 NOTE — Therapy (Signed)
OUTPATIENT PHYSICAL THERAPY TREATMENT NOTE   Patient Name: Tanner Figueroa MRN: [id number] DOB:Aug 31, 1995, 27 y.o., male Today's Date: 08/27/2022  PCP: NA   REFERRING PROVIDER: Leandrew Koyanagi, MD  END OF SESSION:   PT End of Session - 08/27/22 1059     Visit Number 7    Number of Visits 17    Date for PT Re-Evaluation 09/17/22    Authorization Type [place] [employer]    Authorization Time Period 08/14/22-09/24/22    Authorization - Visit Number 2    Authorization - Number of Visits 12    PT Start Time 1100    PT Stop Time 1143    PT Time Calculation (min) 43 min    Activity Tolerance Patient tolerated treatment well;No increased pain    Behavior During Therapy WFL for tasks assessed/performed                  Past Medical History:  Diagnosis Date   Eczema    OCD (obsessive compulsive disorder)    Patellar disorder    Past Surgical History:  Procedure Laterality Date   FOREIGN BODY REMOVAL Right 07/10/2022   Procedure: FOREIGN BODY REMOVAL ADULT;  Surgeon: Leandrew Koyanagi, MD;  Location: [place];  Service: Orthopedics;  Laterality: Right;   KNEE ARTHROSCOPY WITH LATERAL RELEASE Right 07/10/2022   Procedure: LATERAL RELEASE;  Surgeon: Leandrew Koyanagi, MD;  Location: [place];  Service: Orthopedics;  Laterality: Right;   KNEE ARTHROSCOPY WITH MEDIAL PATELLAR FEMORAL LIGAMENT RECONSTRUCTION Right 07/10/2022   Procedure: RIGHT KNEE ARTHROSCOPY WITH MEDIAL PATELLAR FEMORAL LIGAMENT RECONSTRUCTION;  Surgeon: Leandrew Koyanagi, MD;  Location: [place];  Service: Orthopedics;  Laterality: Right;   WISDOM TOOTH EXTRACTION     Patient Active Problem List   Diagnosis Date Noted   Acute lateral meniscus tear of right knee 07/10/2022   Traumatic avulsion of patellofemoral ligament, right, initial encounter 05/09/2022   Patellofemoral dysfunction of right knee 05/09/2022   Chondral loose body of right knee joint 05/09/2022    REFERRING  DIAG:  F81.829H (ICD-10-CM) - Traumatic avulsion of patellofemoral ligament, right, initial encounter  M25.861 (ICD-10-CM) - Patellofemoral dysfunction of right knee  S83.281A (ICD-10-CM) - Acute lateral meniscus tear of right knee, initial encounter    THERAPY DIAG:  Acute pain of right knee  Muscle weakness (generalized)  Other abnormalities of gait and mobility  Localized edema  Rationale for Evaluation and Treatment Rehabilitation  PERTINENT HISTORY: s/p R knee arthroscopy w/ partial lateral meniscectomy, patellar chondroplasty, MPFL reconstruction, and lateral release  PRECAUTIONS: WBAT with brace per Op Note; ROM with PSO brace per 07/17/22 physician notes, no lateral patellar mobilizations   SUBJECTIVE:  SUBJECTIVE STATEMENT:   Mild pain at present, pt states he has been doing well overall. Improving tolerance at work with less rest breaks, still getting some shooting pain with prolonged sitting      PAIN:  Are you having pain? 08/27/2022  NPRS scale: 0- 7/10 at worst (up to 9/10 on eval) Pain location: Right knee Pain description: Tight, pulling, sharp, aching Aggravating factors: Bending the knee, walking, sleeping on stomach, stairs Relieving factors: Rest, using the brace, medication   OBJECTIVE: (objective measures completed at initial evaluation unless otherwise dated)   DIAGNOSTIC FINDINGS:  Right knee MRI 03/25/2022: IMPRESSION: 1. Ruptured PCL. 2. Grade 1 sprains of the proximal fibular collateral ligament and distal popliteus tendon. 3. Contusions of the anterior medial tibial plateau and inferior patella. 4. 1.0 cm intra-articular body inferior to the posterior horn of the lateral meniscus, of unclear donor site. This appears separate from the meniscus. No meniscal tear. 5.  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 No marrow edema or surrounding soft tissue swelling to suggest acute injury. 6. Moderate joint effusion.   PATIENT SURVEYS:  LEFS 25/80 LEFS 08/27/22 53/80   COGNITION:           Overall cognitive status: Within functional limits for tasks assessed                          SENSATION: Grossly intact aside from subjective numbness + reduction in sensation medial/lateral/distal knee on R   EDEMA/OBSERVATION:  Circumferential: 47.5cm at patella R, 44cm on L 43cm at tibial tuberosity R, 42.5cm on L Incisions appear to be healing well, no overt erythema or drainage, appear WNL     PALPATION: Mild TTP lateral quad and TFL on R Gross tenderness throughout knee  Deferred patellar mobilizations given acuity of surgery   LOWER EXTREMITY ROM:   Active ROM Right eval Left eval R AROM 08/27/22  Knee flexion Seated, 85 deg 118 117 supine  Knee extension Lacking 4 deg 0 0  Comments: Pt self-initiates bending of knee during subjective, quickly measured while in position, deferred further focal assessment of knee flexion given recency of surgery and available ROM   LOWER EXTREMITY MMT:   MMT Right eval Left eval  Hip flexion      Hip extension      Hip abduction      Knee flexion      Knee extension      Comments: NT given recency of surgery     LOWER EXTREMITY SPECIAL TESTS:  Not assessed   FUNCTIONAL TESTS:  Sit to stand transfer from standard chair: B UE support, R knee extended relative to L, weight shift to L  08/27/22: 5xSTS standard chair 15sec w UE support   GAIT: Assistive device utilized: None Level of assistance: Complete Independence Comments: pt arrives without brace or AD; antalgic gait, reduced R knee ROM throughout all phases of gait, partial step through pattern       TODAY'S TREATMENT: [facility] Adult PT Treatment:                                                DATE: 08/27/22 Therapeutic Exercise: Recumbent bike during subjective SLR 2x15  cues for form and control Mini squat 2x10 cues for appropriate knee mechanics, no UE support 2inch lateral step up 2x12 cues for control 4 inch fwd step up 2x8 cues for form and control  Sidelying hip abduction 2x8 RLE only cues for form and control   Therapeutic Activity: LEFS + education 5xSTS + education MSK assessment + education   [facility] Adult PT Treatment:                                                DATE: 08/20/22  Therapeutic Exercise: Recumbent bike for ROM 59min at max seat height SLR x5 super set with x5 STS from raised mat, 4 sets of each cues for mechanics and appropriate quad contraction Standing hip abduction 3x12 each LE, cues for min knee bend for quad/HS co contraction Knee flexion AROM 2x12 each LE  4inch step weight shift w UE support 2x10 cues for appropriate setup w/ ROM  HEP review    [name] Adult PT Treatment:                                                DATE: 08/08/22 Therapeutic Exercise: SLR 1 x 15 - increased quad lag noted with fatigue Recumbent bike L1 x 4 min (seated at highest position) Seated calf stretch with belt 1 x 30 sec Seated calf stretch 1 x 30 sec  Mini counter squats 2 x 10 - verbal cues for proper foot/ knee positioning to reudce issue.  Updated HEP for stretch, standing hip strengthening.    PATIENT EDUCATION:  Education details: rationale for interventions, relevant anatomy/physiology Person educated: Patient Education method: Explanation, Demonstration, Tactile cues, Verbal cues, and Handouts Education comprehension: verbalized understanding, returned demonstration, verbal cues required, tactile cues required, and needs further education   HOME EXERCISE PROGRAM: Access Code: [email or web address] URL: [email or web address] Date: 08/08/2022 Prepared by:  Starr [place]  Exercises - Supine Quad Set  - 1 x daily - 7 x weekly - 2 sets - 20 reps - Standing Knee Flexion AROM with Chair Support  - 1 x daily - 7 x weekly - 2 sets - 10 reps - Heel Raises with Counter Support  - 1 x daily - 7 x weekly - 2 sets - 15 reps - SLR (Mirrored)  - 1 x daily - 7 x weekly - 3 sets - 10 - 15 reps - 1 hold - Supine Bridge  - 1 x daily - 7 x weekly - 2 sets - 10 reps - Seated Calf Stretch with Strap  - 1 x daily - 7 x weekly - 2 sets - 10 reps - Standing Gastroc Stretch  - 2 x daily - 7 x weekly - 2 sets - 2 reps - 30 hold - Seated Hamstring Stretch  - 1 x daily - 7 x weekly - 2 sets - 2 reps - 30 hold - Standing Hamstring Stretch on Counter  - 1 x daily - 7 x weekly - 2 sets - Mini Squat with Counter Support  - 1 x daily - 7 x weekly - 2 sets - 10 reps - Standing Hip Abduction with Counter Support  - 1 x daily - 7 x weekly - 3 sets - 15 reps  ASSESSMENT: CLINICAL IMPRESSION: Pt arrives without significant pain, states he continues to slowly improve. Today focusing on progression of open and closed chain activity with increased volume, increased emphasis on control throughout available ROM. No adverse events, pt tolerates session well overall with mild transient discomfort during some activity that resolves with rest. Pt departs today's session in no acute distress, all voiced questions/concerns addressed appropriately from PT perspective.       OBJECTIVE IMPAIRMENTS Abnormal gait, decreased activity tolerance, decreased balance, decreased mobility, difficulty walking, decreased ROM, decreased strength, increased edema, impaired flexibility, impaired sensation, improper body mechanics, and pain.    ACTIVITY LIMITATIONS carrying, lifting, bending, sitting, standing, squatting, sleeping, stairs, transfers, and locomotion level   PARTICIPATION LIMITATIONS: meal prep, cleaning, laundry, shopping, community activity, occupation, and yard work   PERSONAL FACTORS  Past/current experiences, Profession, and Time since onset of injury/illness/exacerbation are also affecting patient's functional outcome.    REHAB POTENTIAL: Good   CLINICAL DECISION MAKING: Stable/uncomplicated   EVALUATION COMPLEXITY: Low     GOALS: Goals reviewed with patient? Yes SHORT TERM GOALS: Target date: 09/03/2022   1. Pt will demonstrate appropriate understanding and performance of initially prescribed HEP in order to facilitate improved independence with management of symptoms.  Baseline: HEP provided on eval Goal status: ONGOING   2. Pt will score greater than or equal to 35/80 on LEFS in order to demonstrate improved perception of function due to symptoms.            Baseline: 25  08/27/22: 53            Goal status: MET              LONG TERM GOALS: Target date: 10/15/2022     Patient will be I with final HEP to maintain progress from PT. Baseline: HEP provided at eval Goal status: INITIAL   2.  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  Patient will demonstrate right extension strength 5/5 MMT in order to avoid knee giving way with walking or squatting tasks Baseline: deferred given acuity of surgery Goal status: INITIAL   4.  Patient will report pain level </= 3/10 with standing and walking greater than 1hr to improve ability to perform work related tasks Baseline: limited d/t pain/swelling, pain 0-9/10 Goal status: INITIAL   5. Pt will score > or equal to 50/80 on LEFS in order to indicate improved perception of function due to pain.             Baseline: 25/80            Goal status: INITIAL     PLAN: PT FREQUENCY: 2x/week   PT DURATION:  12 weeks    PLANNED INTERVENTIONS: Therapeutic exercises, Therapeutic activity, Neuromuscular re-education, Balance training, Gait training, Patient/Family education, Self  Care, Joint mobilization, Stair training, DME instructions, Aquatic Therapy, Dry Needling, Electrical stimulation, Cryotherapy, Moist heat, scar mobilization, Taping, Vasopneumatic device, Manual therapy, and Re-evaluation   PLAN FOR NEXT SESSION:  progress ROM/gait within precautions as able/tolerated. Emphasis on quad activation. HEP update + handout.     Ashley Murrain PT, DPT 08/27/2022 12:31 PM

## 2022-08-27 ENCOUNTER — Encounter: Payer: Self-pay | Admitting: Physical Therapy

## 2022-08-27 ENCOUNTER — Ambulatory Visit: Payer: Medicaid Other | Admitting: Physical Therapy

## 2022-08-27 DIAGNOSIS — M6281 Muscle weakness (generalized): Secondary | ICD-10-CM

## 2022-08-27 DIAGNOSIS — M25561 Pain in right knee: Secondary | ICD-10-CM

## 2022-08-27 DIAGNOSIS — R600 Localized edema: Secondary | ICD-10-CM

## 2022-08-27 DIAGNOSIS — R2689 Other abnormalities of gait and mobility: Secondary | ICD-10-CM

## 2022-08-29 ENCOUNTER — Encounter: Payer: Self-pay | Admitting: Physical Therapy

## 2022-08-29 ENCOUNTER — Ambulatory Visit: Payer: Medicaid Other | Attending: Orthopaedic Surgery | Admitting: Physical Therapy

## 2022-08-29 DIAGNOSIS — M6281 Muscle weakness (generalized): Secondary | ICD-10-CM | POA: Diagnosis present

## 2022-08-29 DIAGNOSIS — R2689 Other abnormalities of gait and mobility: Secondary | ICD-10-CM | POA: Insufficient documentation

## 2022-08-29 DIAGNOSIS — R600 Localized edema: Secondary | ICD-10-CM | POA: Insufficient documentation

## 2022-08-29 DIAGNOSIS — M25561 Pain in right knee: Secondary | ICD-10-CM | POA: Diagnosis present

## 2022-08-29 NOTE — Therapy (Signed)
OUTPATIENT PHYSICAL THERAPY TREATMENT NOTE   Patient Name: Tanner Figueroa MRN: [id number] DOB:[birth year], 27 y.o., male Today's Date: 08/29/2022  PCP: NA   REFERRING PROVIDER: Leandrew Koyanagi, MD  END OF SESSION:   PT End of Session - 08/29/22 1022     Visit Number 8    Number of Visits 17    Date for PT Re-Evaluation 09/17/22    Authorization Type [place] [employer]    Authorization Time Period 08/14/22-09/24/22    Authorization - Visit Number 3    Authorization - Number of Visits 12    PT Start Time 1022   late check in   PT Stop Time 1101    PT Time Calculation (min) 39 min    Activity Tolerance Patient tolerated treatment well;No increased pain    Behavior During Therapy WFL for tasks assessed/performed                   Past Medical History:  Diagnosis Date   Eczema    OCD (obsessive compulsive disorder)    Patellar disorder    Past Surgical History:  Procedure Laterality Date   FOREIGN BODY REMOVAL Right 07/10/2022   Procedure: FOREIGN BODY REMOVAL ADULT;  Surgeon: Leandrew Koyanagi, MD;  Location: [place];  Service: Orthopedics;  Laterality: Right;   KNEE ARTHROSCOPY WITH LATERAL RELEASE Right 07/10/2022   Procedure: LATERAL RELEASE;  Surgeon: Leandrew Koyanagi, MD;  Location: [place];  Service: Orthopedics;  Laterality: Right;   KNEE ARTHROSCOPY WITH MEDIAL PATELLAR FEMORAL LIGAMENT RECONSTRUCTION Right 07/10/2022   Procedure: RIGHT KNEE ARTHROSCOPY WITH MEDIAL PATELLAR FEMORAL LIGAMENT RECONSTRUCTION;  Surgeon: Leandrew Koyanagi, MD;  Location: [place];  Service: Orthopedics;  Laterality: Right;   WISDOM TOOTH EXTRACTION     Patient Active Problem List   Diagnosis Date Noted   Acute lateral meniscus tear of right knee 07/10/2022   Traumatic avulsion of patellofemoral ligament, right, initial encounter 05/09/2022   Patellofemoral dysfunction of right knee 05/09/2022   Chondral loose body of right knee joint 05/09/2022     REFERRING DIAG:  P61.950D (ICD-10-CM) - Traumatic avulsion of patellofemoral ligament, right, initial encounter  M25.861 (ICD-10-CM) - Patellofemoral dysfunction of right knee  S83.281A (ICD-10-CM) - Acute lateral meniscus tear of right knee, initial encounter    THERAPY DIAG:  Acute pain of right knee  Muscle weakness (generalized)  Other abnormalities of gait and mobility  Localized edema  Rationale for Evaluation and Treatment Rehabilitation  PERTINENT HISTORY: s/p R knee arthroscopy w/ partial lateral meniscectomy, patellar chondroplasty, MPFL reconstruction, and lateral release  PRECAUTIONS: WBAT with brace per Op Note; ROM with PSO brace per 07/17/22 physician notes, no lateral patellar mobilizations   SUBJECTIVE:  SUBJECTIVE STATEMENT:   Pt arrives w/o R knee pain, reports some L knee discomfort he woke up with this morning. No issues after last session, continues to endorse improved work tolerance   PAIN:  Are you having pain? 08/29/2022  NPRS scale: 0- 7/10 at worst (up to 9/10 on eval) Pain location: Right knee Pain description: Tight, pulling, sharp, aching Aggravating factors: Bending the knee, walking, sleeping on stomach, stairs Relieving factors: Rest, using the brace, medication   OBJECTIVE: (objective measures completed at initial evaluation unless otherwise dated)   DIAGNOSTIC FINDINGS:  Right knee MRI 03/25/2022: IMPRESSION: 1. Ruptured PCL. 2. Grade 1 sprains of the proximal fibular collateral ligament and distal popliteus tendon. 3. Contusions of the anterior medial tibial plateau and inferior patella. 4. 1.0 cm intra-articular body inferior to the posterior horn of the lateral meniscus, of unclear donor site. This appears separate from the meniscus. No meniscal tear. 5.  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 No marrow edema or surrounding soft tissue swelling to suggest acute injury. 6. Moderate joint effusion.   PATIENT SURVEYS:  LEFS 25/80 LEFS 08/27/22 53/80   COGNITION:           Overall cognitive status: Within functional limits for tasks assessed                          SENSATION: Grossly intact aside from subjective numbness + reduction in sensation medial/lateral/distal knee on R   EDEMA/OBSERVATION:  Circumferential: 47.5cm at patella R, 44cm on L 43cm at tibial tuberosity R, 42.5cm on L Incisions appear to be healing well, no overt erythema or drainage, appear WNL     PALPATION: Mild TTP lateral quad and TFL on R Gross tenderness throughout knee  Deferred patellar mobilizations given acuity of surgery   LOWER EXTREMITY ROM:   Active ROM Right eval Left eval R AROM 08/27/22  Knee flexion Seated, 85 deg 118 117 supine  Knee extension Lacking 4 deg 0 0  Comments: Pt self-initiates bending of knee during subjective, quickly measured while in position, deferred further focal assessment of knee flexion given recency of surgery and available ROM   LOWER EXTREMITY MMT:   MMT Right eval Left eval  Hip flexion      Hip extension      Hip abduction      Knee flexion      Knee extension      Comments: NT given recency of surgery     LOWER EXTREMITY SPECIAL TESTS:  Not assessed   FUNCTIONAL TESTS:  Sit to stand transfer from standard chair: B UE support, R knee extended relative to L, weight shift to L  08/27/22: 5xSTS standard chair 15sec w UE support   GAIT: Assistive device utilized: None Level of assistance: Complete Independence Comments: pt arrives without brace or AD; antalgic gait, reduced R knee ROM throughout all phases of gait, partial step through pattern       TODAY'S TREATMENT: [facility] Adult PT Treatment:                                                DATE: 08/29/22 Therapeutic Exercise: Bike 42min during subjective  Mini squat x3 (discontinued due  to L knee pain) SLR 3x15 RLE only cues for pacing and full extension SL hip abduction RLE 2x10 cues for form and hip position  4inch lateral step up 2x10 RLE only cues for form HEP update + handout  Therapeutic Activity: 4inch fwd step up 2x10 RLE only cues for form and knee control on descent Discussion re: activity modification, pacing of activities, monitoring L knee symptoms and discussing with provider as needed    [facility] Adult PT Treatment:                                                DATE: 08/27/22 Therapeutic Exercise: Recumbent bike during subjective SLR 2x15 cues for form and control Mini squat 2x10 cues for appropriate knee mechanics, no UE support 2inch lateral step up 2x12 cues for control 4 inch fwd step up 2x8 cues for form and control  Sidelying hip abduction 2x8 RLE only cues for form and control   Therapeutic Activity: LEFS + education 5xSTS + education MSK assessment + education    [facility] Adult PT Treatment:                                                DATE: 08/08/22 Therapeutic Exercise: SLR 1 x 15 - increased quad lag noted with fatigue Recumbent bike L1 x 4 min (seated at highest position) Seated calf stretch with belt 1 x 30 sec Seated calf stretch 1 x 30 sec  Mini counter squats 2 x 10 - verbal cues for proper foot/ knee positioning to reudce issue.  Updated HEP for stretch, standing hip strengthening.    PATIENT EDUCATION:  Education details: rationale for interventions, relevant anatomy/physiology, HEP Person educated: Patient Education method: Explanation, Demonstration, Tactile cues, Verbal cues, and Handouts Education comprehension: verbalized understanding, returned demonstration, verbal cues required, tactile cues required, and needs further education    HOME EXERCISE PROGRAM: Access Code: [email or web address] URL: [email or web address] Date: 08/29/2022 Prepared by: Fransisco Hertz  Exercises - Standing Knee Flexion AROM with Chair Support  - 1 x daily - 7 x weekly - 2 sets - 10 reps - Heel Raises with Counter Support  - 1 x daily - 7 x weekly - 2 sets - 15 reps - SLR (Mirrored)  - 1 x daily - 7 x weekly - 2 sets - 155 reps - 1 hold - Mini Squat with Counter Support  - 1 x daily - 7 x weekly - 2 sets - 10 reps - Sidelying Hip Abduction  - 1 x daily - 7 x weekly - 2 sets - 10 reps   ASSESSMENT: CLINICAL IMPRESSION: Pt arrives without R knee pain, continues to report good progress with return to work. Pt does endorse L knee pain that he woke up with this morning, states it has bothered him intermittently since the initial injury to R knee but not typically this severe. Denies any new injuries or change in activity aside from increasing activity at work - education on monitoring symptoms, pacing of activities and increased rest breaks to assess response, if symptoms persist/worsen encouraged to reach out to provider. Otherwise pt tolerates session well, no pain in R knee and able to progress w/ step up activities. No adverse events, denies  change in L knee pain on departure. Pt departs today's session in no acute distress, all voiced questions/concerns addressed appropriately from PT perspective.      OBJECTIVE IMPAIRMENTS Abnormal gait, decreased activity tolerance, decreased balance, decreased mobility, difficulty walking, decreased ROM, decreased strength, increased edema, impaired flexibility, impaired sensation, improper body mechanics, and pain.    ACTIVITY LIMITATIONS carrying, lifting, bending, sitting, standing, squatting, sleeping, stairs, transfers, and locomotion level   PARTICIPATION LIMITATIONS: meal prep, cleaning, laundry, shopping, community activity, occupation, and yard work   PERSONAL FACTORS Past/current experiences,  Profession, and Time since onset of injury/illness/exacerbation are also affecting patient's functional outcome.    REHAB POTENTIAL: Good   CLINICAL DECISION MAKING: Stable/uncomplicated   EVALUATION COMPLEXITY: Low     GOALS: Goals reviewed with patient? Yes SHORT TERM GOALS: Target date: 09/03/2022   1. Pt will demonstrate appropriate understanding and performance of initially prescribed HEP in order to facilitate improved independence with management of symptoms.  Baseline: HEP provided on eval Goal status: ONGOING   2. Pt will score greater than or equal to 35/80 on LEFS in order to demonstrate improved perception of function due to symptoms.            Baseline: 25  08/27/22: 53            Goal status: MET              LONG TERM GOALS: Target date: 10/15/2022     Patient will be I with final HEP to maintain progress from PT. Baseline: HEP provided at eval Goal status: INITIAL   2.  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  Patient will demonstrate right extension strength 5/5 MMT in order to avoid knee giving way with walking or squatting tasks Baseline: deferred given acuity of surgery Goal status: INITIAL   4.  Patient will report pain level </= 3/10 with standing and walking greater than 1hr to improve ability to perform work related tasks Baseline: limited d/t pain/swelling, pain 0-9/10 Goal status: INITIAL   5. Pt will score > or equal to 50/80 on LEFS in order to indicate improved perception of function due to pain.             Baseline: 25/80            Goal status: INITIAL     PLAN: PT FREQUENCY: 2x/week   PT DURATION:  12 weeks    PLANNED INTERVENTIONS: Therapeutic exercises, Therapeutic activity, Neuromuscular re-education, Balance training, Gait training, Patient/Family education, Self Care, Joint mobilization,  Stair training, DME instructions, Aquatic Therapy, Dry Needling, Electrical stimulation, Cryotherapy, Moist heat, scar mobilization, Taping, Vasopneumatic device, Manual therapy, and Re-evaluation   PLAN FOR NEXT SESSION:   progress ROM/gait within precautions as able/tolerated. Emphasis on quad activation.     Leeroy Cha PT, DPT 08/29/2022 12:38 PM

## 2022-09-02 NOTE — Therapy (Incomplete)
OUTPATIENT PHYSICAL THERAPY TREATMENT NOTE   Patient Name: Tanner Figueroa MRN: [id number] DOB:August 15, 1995, 27 y.o., male Today's Date: 09/02/2022  PCP: NA   REFERRING PROVIDER: [name], MD  END OF SESSION:           Past Medical History:  Diagnosis Date   Eczema    OCD (obsessive compulsive disorder)    Patellar disorder    Past Surgical History:  Procedure Laterality Date   FOREIGN BODY REMOVAL Right 07/10/2022   Procedure: FOREIGN BODY REMOVAL ADULT;  Surgeon: Tanner Kos, MD;  Location: [facility];  Service: Orthopedics;  Laterality: Right;   KNEE ARTHROSCOPY WITH LATERAL RELEASE Right 07/10/2022   Procedure: LATERAL RELEASE;  Surgeon: Tanner Kos, MD;  Location: [facility];  Service: Orthopedics;  Laterality: Right;   KNEE ARTHROSCOPY WITH MEDIAL PATELLAR FEMORAL LIGAMENT RECONSTRUCTION Right 07/10/2022   Procedure: RIGHT KNEE ARTHROSCOPY WITH MEDIAL PATELLAR FEMORAL LIGAMENT RECONSTRUCTION;  Surgeon: Tanner Kos, MD;  Location: [facility];  Service: Orthopedics;  Laterality: Right;   WISDOM TOOTH EXTRACTION     Patient Active Problem List   Diagnosis Date Noted   Acute lateral meniscus tear of right knee 07/10/2022   Traumatic avulsion of patellofemoral ligament, right, initial encounter 05/09/2022   Patellofemoral dysfunction of right knee 05/09/2022   Chondral loose body of right knee joint 05/09/2022    REFERRING DIAG:  A35.573U (ICD-10-CM) - Traumatic avulsion of patellofemoral ligament, right, initial encounter  M25.861 (ICD-10-CM) - Patellofemoral dysfunction of right knee  S83.281A (ICD-10-CM) - Acute lateral meniscus tear of right knee, initial encounter    THERAPY DIAG:  No diagnosis found.  Rationale for Evaluation and Treatment Rehabilitation  PERTINENT HISTORY: s/p R knee arthroscopy w/ partial lateral meniscectomy, patellar chondroplasty, MPFL reconstruction, and lateral  release  PRECAUTIONS: WBAT with brace per Op Note; ROM with PSO brace per 07/17/22 physician notes, no lateral patellar mobilizations   SUBJECTIVE:                                                                                                                                                                                      SUBJECTIVE STATEMENT:   ***  *** Pt arrives w/o R knee pain, reports some L knee discomfort he woke up with this morning. No issues after last session, continues to endorse improved work tolerance   PAIN:  Are you having pain? 09/02/2022  NPRS scale: 0- 7/10 at worst (up to 9/10 on eval) Pain location: Right knee Pain description: Tight, pulling, sharp, aching Aggravating factors: Bending the knee, walking, sleeping on stomach, stairs Relieving factors: Rest, using the brace,  medication   OBJECTIVE: (objective measures completed at initial evaluation unless otherwise dated)   DIAGNOSTIC FINDINGS:  Right knee MRI 03/25/2022: IMPRESSION: 1. Ruptured PCL. 2. Grade 1 sprains of the proximal fibular collateral ligament and distal popliteus tendon. 3. Contusions of the anterior medial tibial plateau and inferior patella. 4. 1.0 cm intra-articular body inferior to the posterior horn of the lateral meniscus, of unclear donor site. This appears separate from the meniscus. No meniscal tear. 5. 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 No marrow edema or surrounding soft tissue swelling to suggest acute injury. 6. Moderate joint effusion.   PATIENT SURVEYS:  LEFS 25/80 LEFS 08/27/22 53/80   COGNITION:           Overall cognitive status: Within functional limits for tasks assessed                          SENSATION: Grossly intact aside from subjective numbness + reduction in sensation medial/lateral/distal knee  on R   EDEMA/OBSERVATION:  Circumferential: 47.5cm at patella R, 44cm on L 43cm at tibial tuberosity R, 42.5cm on L Incisions appear to be healing well, no overt erythema or drainage, appear WNL     PALPATION: Mild TTP lateral quad and TFL on R Gross tenderness throughout knee  Deferred patellar mobilizations given acuity of surgery   LOWER EXTREMITY ROM:   Active ROM Right eval Left eval R AROM 08/27/22  Knee flexion Seated, 85 deg 118 117 supine  Knee extension Lacking 4 deg 0 0  Comments: Pt self-initiates bending of knee during subjective, quickly measured while in position, deferred further focal assessment of knee flexion given recency of surgery and available ROM   LOWER EXTREMITY MMT:   MMT Right eval Left eval  Hip flexion      Hip extension      Hip abduction      Knee flexion      Knee extension      Comments: NT given recency of surgery     LOWER EXTREMITY SPECIAL TESTS:  Not assessed   FUNCTIONAL TESTS:  Sit to stand transfer from standard chair: B UE support, R knee extended relative to L, weight shift to L  08/27/22: 5xSTS standard chair 15sec w UE support   GAIT: Assistive device utilized: None Level of assistance: Complete Independence Comments: pt arrives without brace or AD; antalgic gait, reduced R knee ROM throughout all phases of gait, partial step through pattern      TODAY'S TREATMENT: [facility] Adult PT Treatment:                                                DATE: 09/03/22 Therapeutic Exercise: *** Manual Therapy: *** Neuromuscular re-ed: *** Therapeutic Activity: *** Modalities: *** Self Care: ***   Tanner Figueroa Adult PT Treatment:                                                DATE: 08/29/22 Therapeutic Exercise: Bike 14min during subjective  Mini squat x3 (discontinued due to L knee pain) SLR 3x15 RLE only  cues for pacing and full extension SL hip abduction RLE 2x10 cues for form and hip position  4inch lateral step up 2x10 RLE only cues  for form HEP update + handout  Therapeutic Activity: 4inch fwd step up 2x10 RLE only cues for form and knee control on descent Discussion re: activity modification, pacing of activities, monitoring L knee symptoms and discussing with provider as needed    Tanner Figueroa [facility] Adult PT Treatment:                                                DATE: 08/27/22 Therapeutic Exercise: Recumbent bike 74min during subjective SLR 2x15 cues for form and control Mini squat 2x10 cues for appropriate knee mechanics, no UE support 2inch lateral step up 2x12 cues for control 4 inch fwd step up 2x8 cues for form and control  Sidelying hip abduction 2x8 RLE only cues for form and control   Therapeutic Activity: LEFS + education 5xSTS + education MSK assessment + education   PATIENT EDUCATION:  Education details: rationale for interventions, relevant anatomy/physiology, HEP Person educated: Patient Education method: Explanation, Demonstration, Tactile cues, Verbal cues, and Handouts Education comprehension: verbalized understanding, returned demonstration, verbal cues required, tactile cues required, and needs further education   HOME EXERCISE PROGRAM: Access Code: [email or web address] URL: [email or web address] Date: 08/29/2022 Prepared by: Enis Slipper  Exercises - Standing Knee Flexion AROM with Chair Support  - 1 x daily - 7 x weekly - 2 sets - 10 reps - Heel Raises with Counter Support  - 1 x daily - 7 x weekly - 2 sets - 15 reps - SLR (Mirrored)  - 1 x daily - 7 x weekly - 2 sets - 155 reps - 1 hold - Mini Squat with Counter Support  - 1 x daily - 7 x weekly - 2 sets - 10 reps - Sidelying Hip Abduction  - 1 x daily - 7 x weekly - 2 sets - 10 reps   ASSESSMENT: CLINICAL IMPRESSION: ***  *** Pt arrives without R knee pain, continues to report good progress with return to work. Pt does endorse L knee pain that he woke up with this morning, states it has bothered him intermittently since the initial  injury to R knee but not typically this severe. Denies any new injuries or change in activity aside from increasing activity at work - education on monitoring symptoms, pacing of activities and increased rest breaks to assess response, if symptoms persist/worsen encouraged to reach out to provider. Otherwise pt tolerates session well, no pain in R knee and able to progress w/ step up activities. No adverse events, denies change in L knee pain on departure. Pt departs today's session in no acute distress, all voiced questions/concerns addressed appropriately from PT perspective.      OBJECTIVE IMPAIRMENTS Abnormal gait, decreased activity tolerance, decreased balance, decreased mobility, difficulty walking, decreased ROM, decreased strength, increased edema, impaired flexibility, impaired sensation, improper body mechanics, and pain.    ACTIVITY LIMITATIONS carrying, lifting, bending, sitting, standing, squatting, sleeping, stairs, transfers, and locomotion level   PARTICIPATION LIMITATIONS: meal prep, cleaning, laundry, shopping, community activity, occupation, and yard work   PERSONAL FACTORS Past/current experiences, Profession, and Time since onset of injury/illness/exacerbation are also affecting patient's functional outcome.    REHAB POTENTIAL: Good   CLINICAL DECISION MAKING: Stable/uncomplicated   EVALUATION COMPLEXITY: Low  GOALS: Goals reviewed with patient? Yes SHORT TERM GOALS: Target date: 09/03/2022   1. Pt will demonstrate appropriate understanding and performance of initially prescribed HEP in order to facilitate improved independence with management of symptoms.  Baseline: HEP provided on eval Goal status: ONGOING   2. Pt will score greater than or equal to 35/80 on LEFS in order to demonstrate improved perception of function due to symptoms.            Baseline: 25  08/27/22: 53            Goal status: MET              LONG TERM GOALS: Target date: 10/15/2022      Patient will be I with final HEP to maintain progress from PT. Baseline: HEP provided at eval Goal status: INITIAL   2.  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  Patient will demonstrate right extension strength 5/5 MMT in order to avoid knee giving way with walking or squatting tasks Baseline: deferred given acuity of surgery Goal status: INITIAL   4.  Patient will report pain level </= 3/10 with standing and walking greater than 1hr to improve ability to perform work related tasks Baseline: limited d/t pain/swelling, pain 0-9/10 Goal status: INITIAL   5. Pt will score > or equal to 50/80 on LEFS in order to indicate improved perception of function due to pain.             Baseline: 25/80            Goal status: INITIAL     PLAN: PT FREQUENCY: 2x/week   PT DURATION:  12 weeks    PLANNED INTERVENTIONS: Therapeutic exercises, Therapeutic activity, Neuromuscular re-education, Balance training, Gait training, Patient/Family education, Self Care, Joint mobilization, Stair training, DME instructions, Aquatic Therapy, Dry Needling, Electrical stimulation, Cryotherapy, Moist heat, scar mobilization, Taping, Vasopneumatic device, Manual therapy, and Re-evaluation   PLAN FOR NEXT SESSION:  ***  progress ROM/gait within precautions as able/tolerated. Emphasis on quad activation.     Leeroy Cha PT, DPT 09/02/2022 4:26 PM

## 2022-09-03 ENCOUNTER — Ambulatory Visit: Payer: Medicaid Other | Admitting: Physical Therapy

## 2022-09-05 ENCOUNTER — Ambulatory Visit: Payer: Medicaid Other | Admitting: Physical Therapy

## 2022-09-05 NOTE — Therapy (Incomplete)
OUTPATIENT PHYSICAL THERAPY TREATMENT NOTE   Patient Name: Tanner Figueroa MRN: [id number] DOB:June 11, 1996, 27 y.o., male Today's Date: 09/05/2022  PCP: NA   REFERRING PROVIDER: [name], MD  END OF SESSION:           Past Medical History:  Diagnosis Date   Eczema    OCD (obsessive compulsive disorder)    Patellar disorder    Past Surgical History:  Procedure Laterality Date   FOREIGN BODY REMOVAL Right 07/10/2022   Procedure: FOREIGN BODY REMOVAL ADULT;  Surgeon: [name], MD;  Location: [facility];  Service: Orthopedics;  Laterality: Right;   KNEE ARTHROSCOPY WITH LATERAL RELEASE Right 07/10/2022   Procedure: LATERAL RELEASE;  Surgeon: [name], MD;  Location: [facility];  Service: Orthopedics;  Laterality: Right;   KNEE ARTHROSCOPY WITH MEDIAL PATELLAR FEMORAL LIGAMENT RECONSTRUCTION Right 07/10/2022   Procedure: RIGHT KNEE ARTHROSCOPY WITH MEDIAL PATELLAR FEMORAL LIGAMENT RECONSTRUCTION;  Surgeon: [name], MD;  Location: [facility];  Service: Orthopedics;  Laterality: Right;   WISDOM TOOTH EXTRACTION     Patient Active Problem List   Diagnosis Date Noted   Acute lateral meniscus tear of right knee 07/10/2022   Traumatic avulsion of patellofemoral ligament, right, initial encounter 05/09/2022   Patellofemoral dysfunction of right knee 05/09/2022   Chondral loose body of right knee joint 05/09/2022    REFERRING DIAG:  W09.811B (ICD-10-CM) - Traumatic avulsion of patellofemoral ligament, right, initial encounter  M25.861 (ICD-10-CM) - Patellofemoral dysfunction of right knee  S83.281A (ICD-10-CM) - Acute lateral meniscus tear of right knee, initial encounter    THERAPY DIAG:  No diagnosis found.  Rationale for Evaluation and Treatment Rehabilitation  PERTINENT HISTORY: s/p R knee arthroscopy w/ partial lateral meniscectomy, patellar chondroplasty, MPFL reconstruction, and lateral  release  PRECAUTIONS: WBAT with brace per Op Note; ROM with PSO brace per 07/17/22 physician notes, no lateral patellar mobilizations   SUBJECTIVE:                                                                                                                                                                                      SUBJECTIVE STATEMENT:   ***   PAIN:  Are you having pain? 09/05/2022  NPRS scale: 0- 7/10 at worst (up to 9/10 on eval) Pain location: Right knee Pain description: Tight, pulling, sharp, aching Aggravating factors: Bending the knee, walking, sleeping on stomach, stairs Relieving factors: Rest, using the brace, medication   OBJECTIVE: (objective measures completed at initial evaluation unless otherwise dated)   DIAGNOSTIC FINDINGS:  Right knee MRI 03/25/2022: IMPRESSION: 1. Ruptured PCL. 2. Grade 1 sprains  of the proximal fibular collateral ligament and distal popliteus tendon. 3. Contusions of the anterior medial tibial plateau and inferior patella. 4. 1.0 cm intra-articular body inferior to the posterior horn of the lateral meniscus, of unclear donor site. This appears separate from the meniscus. No meniscal tear. 5. 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 No marrow edema or surrounding soft tissue swelling to suggest acute injury. 6. Moderate joint effusion.   PATIENT SURVEYS:  LEFS 25/80 LEFS 08/27/22 53/80   COGNITION:           Overall cognitive status: Within functional limits for tasks assessed                          SENSATION: Grossly intact aside from subjective numbness + reduction in sensation medial/lateral/distal knee on R   EDEMA/OBSERVATION:  Circumferential: 47.5cm at patella R, 44cm on L 43cm at tibial tuberosity R, 42.5cm on L Incisions appear to be healing well, no overt  erythema or drainage, appear WNL     PALPATION: Mild TTP lateral quad and TFL on R Gross tenderness throughout knee  Deferred patellar mobilizations given acuity of surgery   LOWER EXTREMITY ROM:   Active ROM Right eval Left eval R AROM 08/27/22  Knee flexion Seated, 85 deg 118 117 supine  Knee extension Lacking 4 deg 0 0  Comments: Pt self-initiates bending of knee during subjective, quickly measured while in position, deferred further focal assessment of knee flexion given recency of surgery and available ROM   LOWER EXTREMITY MMT:   MMT Right eval Left eval  Hip flexion      Hip extension      Hip abduction      Knee flexion      Knee extension      Comments: NT given recency of surgery     LOWER EXTREMITY SPECIAL TESTS:  Not assessed   FUNCTIONAL TESTS:  Sit to stand transfer from standard chair: B UE support, R knee extended relative to L, weight shift to L  08/27/22: 5xSTS standard chair 15sec w UE support   GAIT: Assistive device utilized: None Level of assistance: Complete Independence Comments: pt arrives without brace or AD; antalgic gait, reduced R knee ROM throughout all phases of gait, partial step through pattern      TODAY'S TREATMENT: [facility] Adult PT Treatment:                                                DATE: 09/05/2022 Therapeutic Exercise: *** Manual Therapy: *** Neuromuscular re-ed: *** Therapeutic Activity: *** Modalities: *** Self Care: [name] Adult PT Treatment:                                                DATE: 08/29/22 Therapeutic Exercise: Bike 59min during subjective  Mini squat x3 (discontinued due to L knee pain) SLR 3x15 RLE only cues for pacing and full extension SL hip abduction RLE 2x10 cues for form and hip position  4inch lateral step up 2x10 RLE only cues for form HEP update +  handout  Therapeutic Activity: 4inch fwd step up 2x10 RLE only cues for form and knee control on descent Discussion re: activity  modification, pacing of activities, monitoring L knee symptoms and discussing with provider as needed    [facility] Adult PT Treatment:                                                DATE: 08/27/22 Therapeutic Exercise: Recumbent bike 69min during subjective SLR 2x15 cues for form and control Mini squat 2x10 cues for appropriate knee mechanics, no UE support 2inch lateral step up 2x12 cues for control 4 inch fwd step up 2x8 cues for form and control  Sidelying hip abduction 2x8 RLE only cues for form and control   Therapeutic Activity: LEFS + education 5xSTS + education MSK assessment + education    PATIENT EDUCATION:  Education details: rationale for interventions, relevant anatomy/physiology, HEP Person educated: Patient Education method: Explanation, Demonstration, Tactile cues, Verbal cues, and Handouts Education comprehension: verbalized understanding, returned demonstration, verbal cues required, tactile cues required, and needs further education   HOME EXERCISE PROGRAM: Access Code: [email or web address] URL: [email or web address] Date: 08/29/2022 Prepared by: Enis Slipper  Exercises - Standing Knee Flexion AROM with Chair Support  - 1 x daily - 7 x weekly - 2 sets - 10 reps - Heel Raises with Counter Support  - 1 x daily - 7 x weekly - 2 sets - 15 reps - SLR (Mirrored)  - 1 x daily - 7 x weekly - 2 sets - 155 reps - 1 hold - Mini Squat with Counter Support  - 1 x daily - 7 x weekly - 2 sets - 10 reps - Sidelying Hip Abduction  - 1 x daily - 7 x weekly - 2 sets - 10 reps   ASSESSMENT: CLINICAL IMPRESSION: ***    OBJECTIVE IMPAIRMENTS Abnormal gait, decreased activity tolerance, decreased balance, decreased mobility, difficulty walking, decreased ROM, decreased strength, increased edema, impaired flexibility, impaired sensation, improper body mechanics, and pain.    ACTIVITY LIMITATIONS carrying, lifting, bending, sitting, standing, squatting, sleeping, stairs, transfers,  and locomotion level   PARTICIPATION LIMITATIONS: meal prep, cleaning, laundry, shopping, community activity, occupation, and yard work   PERSONAL FACTORS Past/current experiences, Profession, and Time since onset of injury/illness/exacerbation are also affecting patient's functional outcome.    REHAB POTENTIAL: Good   CLINICAL DECISION MAKING: Stable/uncomplicated   EVALUATION COMPLEXITY: Low     GOALS: Goals reviewed with patient? Yes SHORT TERM GOALS: Target date: 09/03/2022   1. Pt will demonstrate appropriate understanding and performance of initially prescribed HEP in order to facilitate improved independence with management of symptoms.  Baseline: HEP provided on eval Goal status: ONGOING   2. Pt will score greater than or equal to 35/80 on LEFS in order to demonstrate improved perception of function due to symptoms.            Baseline: 25  08/27/22: 53            Goal status: MET              LONG TERM GOALS: Target date: 10/15/2022     Patient will be I with final HEP to maintain progress from PT. Baseline: HEP provided at eval Goal status: INITIAL   2.  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  Patient will demonstrate right extension strength 5/5 MMT in order to avoid knee giving way with walking or squatting tasks Baseline: deferred given acuity of surgery Goal status: INITIAL   4.  Patient will report pain level </= 3/10 with standing and walking greater than 1hr to improve ability to perform work related tasks Baseline: limited d/t pain/swelling, pain 0-9/10 Goal status: INITIAL   5. Pt will score > or equal to 50/80 on LEFS in order to indicate improved perception of function due to pain.             Baseline: 25/80            Goal status: INITIAL     PLAN: PT FREQUENCY: 2x/week   PT DURATION:  12 weeks     PLANNED INTERVENTIONS: Therapeutic exercises, Therapeutic activity, Neuromuscular re-education, Balance training, Gait training, Patient/Family education, Self Care, Joint mobilization, Stair training, DME instructions, Aquatic Therapy, Dry Needling, Electrical stimulation, Cryotherapy, Moist heat, scar mobilization, Taping, Vasopneumatic device, Manual therapy, and Re-evaluation   PLAN FOR NEXT SESSION:   progress ROM/gait within precautions as able/tolerated. Emphasis on quad activation.     French Kendra PT, DPT, LAT, ATC  09/05/22  8:21 AM

## 2022-09-09 NOTE — Therapy (Incomplete)
OUTPATIENT PHYSICAL THERAPY TREATMENT NOTE   Patient Name: Tanner Figueroa MRN: [id number] DOB:08/09/1995, 27 y.o., male Today's Date: 09/09/2022  PCP: NA   REFERRING PROVIDER: Leandrew Koyanagi, MD  END OF SESSION:           Past Medical History:  Diagnosis Date   Eczema    OCD (obsessive compulsive disorder)    Patellar disorder    Past Surgical History:  Procedure Laterality Date   FOREIGN BODY REMOVAL Right 07/10/2022   Procedure: FOREIGN BODY REMOVAL ADULT;  Surgeon: Leandrew Koyanagi, MD;  Location: [place];  Service: Orthopedics;  Laterality: Right;   KNEE ARTHROSCOPY WITH LATERAL RELEASE Right 07/10/2022   Procedure: LATERAL RELEASE;  Surgeon: Leandrew Koyanagi, MD;  Location: [place];  Service: Orthopedics;  Laterality: Right;   KNEE ARTHROSCOPY WITH MEDIAL PATELLAR FEMORAL LIGAMENT RECONSTRUCTION Right 07/10/2022   Procedure: RIGHT KNEE ARTHROSCOPY WITH MEDIAL PATELLAR FEMORAL LIGAMENT RECONSTRUCTION;  Surgeon: Leandrew Koyanagi, MD;  Location: [place];  Service: Orthopedics;  Laterality: Right;   WISDOM TOOTH EXTRACTION     Patient Active Problem List   Diagnosis Date Noted   Acute lateral meniscus tear of right knee 07/10/2022   Traumatic avulsion of patellofemoral ligament, right, initial encounter 05/09/2022   Patellofemoral dysfunction of right knee 05/09/2022   Chondral loose body of right knee joint 05/09/2022    REFERRING DIAG:  [id number] (ICD-10-CM) - Traumatic avulsion of patellofemoral ligament, right, initial encounter  M25.861 (ICD-10-CM) - Patellofemoral dysfunction of right knee  S83.281A (ICD-10-CM) - Acute lateral meniscus tear of right knee, initial encounter    THERAPY DIAG:  No diagnosis found.  Rationale for Evaluation and Treatment Rehabilitation  PERTINENT HISTORY: s/p R knee arthroscopy w/ partial lateral meniscectomy, patellar chondroplasty, MPFL reconstruction, and lateral  release  PRECAUTIONS: WBAT with brace per Op Note; ROM with PSO brace per 07/17/22 physician notes, no lateral patellar mobilizations   SUBJECTIVE:                                                                                                                                                                                      SUBJECTIVE STATEMENT:   ***  *** Pt arrives w/o R knee pain, reports some L knee discomfort he woke up with this morning. No issues after last session, continues to endorse improved work tolerance   PAIN:  Are you having pain? 09/09/2022  NPRS scale: 0- 7/10 at worst (up to 9/10 on eval) Pain location: Right knee Pain description: Tight, pulling, sharp, aching Aggravating factors: Bending the knee, walking, sleeping on stomach, stairs Relieving factors: Rest, using the brace,  medication   OBJECTIVE: (objective measures completed at initial evaluation unless otherwise dated)   DIAGNOSTIC FINDINGS:  Right knee MRI 03/25/2022: IMPRESSION: 1. Ruptured PCL. 2. Grade 1 sprains of the proximal fibular collateral ligament and distal popliteus tendon. 3. Contusions of the anterior medial tibial plateau and inferior patella. 4. 1.0 cm intra-articular body inferior to the posterior horn of the lateral meniscus, of unclear donor site. This appears separate from the meniscus. No meniscal tear. 5. 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 No marrow edema or surrounding soft tissue swelling to suggest acute injury. 6. Moderate joint effusion.   PATIENT SURVEYS:  LEFS 25/80 LEFS 08/27/22 53/80   COGNITION:           Overall cognitive status: Within functional limits for tasks assessed                          SENSATION: Grossly intact aside from subjective numbness + reduction in sensation medial/lateral/distal knee  on R   EDEMA/OBSERVATION:  Circumferential: 47.5cm at patella R, 44cm on L 43cm at tibial tuberosity R, 42.5cm on L Incisions appear to be healing well, no overt erythema or drainage, appear WNL     PALPATION: Mild TTP lateral quad and TFL on R Gross tenderness throughout knee  Deferred patellar mobilizations given acuity of surgery   LOWER EXTREMITY ROM:   Active ROM Right eval Left eval R AROM 08/27/22  Knee flexion Seated, 85 deg 118 117 supine  Knee extension Lacking 4 deg 0 0  Comments: Pt self-initiates bending of knee during subjective, quickly measured while in position, deferred further focal assessment of knee flexion given recency of surgery and available ROM   LOWER EXTREMITY MMT:   MMT Right eval Left eval  Hip flexion      Hip extension      Hip abduction      Knee flexion      Knee extension      Comments: NT given recency of surgery     LOWER EXTREMITY SPECIAL TESTS:  Not assessed   FUNCTIONAL TESTS:  Sit to stand transfer from standard chair: B UE support, R knee extended relative to L, weight shift to L  08/27/22: 5xSTS standard chair 15sec w UE support   GAIT: Assistive device utilized: None Level of assistance: Complete Independence Comments: pt arrives without brace or AD; antalgic gait, reduced R knee ROM throughout all phases of gait, partial step through pattern      TODAY'S TREATMENT: [facility] Adult PT Treatment:                                                DATE: 09/10/22 Therapeutic Exercise: *** Manual Therapy: *** Neuromuscular re-ed: *** Therapeutic Activity: *** Modalities: *** Self Care: ***   [name] Adult PT Treatment:                                                DATE: 08/29/22 Therapeutic Exercise: Bike 72mn during subjective  Mini squat x3 (discontinued due to L knee pain) SLR 3x15 RLE only  cues for pacing and full extension SL hip abduction RLE 2x10 cues for form and hip position  4inch lateral step up 2x10 RLE only  cues for form HEP update + handout  Therapeutic Activity: 4inch fwd step up 2x10 RLE only cues for form and knee control on descent Discussion re: activity modification, pacing of activities, monitoring L knee symptoms and discussing with provider as needed    [facility] Adult PT Treatment:                                                DATE: 08/27/22 Therapeutic Exercise: Recumbent bike 41mn during subjective SLR 2x15 cues for form and control Mini squat 2x10 cues for appropriate knee mechanics, no UE support 2inch lateral step up 2x12 cues for control 4 inch fwd step up 2x8 cues for form and control  Sidelying hip abduction 2x8 RLE only cues for form and control   Therapeutic Activity: LEFS + education 5xSTS + education MSK assessment + education   PATIENT EDUCATION:  Education details: rationale for interventions, relevant anatomy/physiology, HEP Person educated: Patient Education method: Explanation, Demonstration, Tactile cues, Verbal cues, and Handouts Education comprehension: verbalized understanding, returned demonstration, verbal cues required, tactile cues required, and needs further education   HOME EXERCISE PROGRAM: Access Code: [email or web address] URL: [email or web address] Date: 08/29/2022 Prepared by: DEnis Slipper Exercises - Standing Knee Flexion AROM with Chair Support  - 1 x daily - 7 x weekly - 2 sets - 10 reps - Heel Raises with Counter Support  - 1 x daily - 7 x weekly - 2 sets - 15 reps - SLR (Mirrored)  - 1 x daily - 7 x weekly - 2 sets - 155 reps - 1 hold - Mini Squat with Counter Support  - 1 x daily - 7 x weekly - 2 sets - 10 reps - Sidelying Hip Abduction  - 1 x daily - 7 x weekly - 2 sets - 10 reps   ASSESSMENT: CLINICAL IMPRESSION: ***  *** Pt arrives without R knee pain, continues to report good progress with return to work. Pt does endorse L knee pain that he woke up with this morning, states it has bothered him intermittently since the  initial injury to R knee but not typically this severe. Denies any new injuries or change in activity aside from increasing activity at work - education on monitoring symptoms, pacing of activities and increased rest breaks to assess response, if symptoms persist/worsen encouraged to reach out to provider. Otherwise pt tolerates session well, no pain in R knee and able to progress w/ step up activities. No adverse events, denies change in L knee pain on departure. Pt departs today's session in no acute distress, all voiced questions/concerns addressed appropriately from PT perspective.      OBJECTIVE IMPAIRMENTS Abnormal gait, decreased activity tolerance, decreased balance, decreased mobility, difficulty walking, decreased ROM, decreased strength, increased edema, impaired flexibility, impaired sensation, improper body mechanics, and pain.    ACTIVITY LIMITATIONS carrying, lifting, bending, sitting, standing, squatting, sleeping, stairs, transfers, and locomotion level   PARTICIPATION LIMITATIONS: meal prep, cleaning, laundry, shopping, community activity, occupation, and yard work   PERSONAL FACTORS Past/current experiences, Profession, and Time since onset of injury/illness/exacerbation are also affecting patient's functional outcome.    REHAB POTENTIAL: Good   CLINICAL DECISION MAKING: Stable/uncomplicated   EVALUATION COMPLEXITY: Low  GOALS: Goals reviewed with patient? Yes SHORT TERM GOALS: Target date: 09/03/2022   1. Pt will demonstrate appropriate understanding and performance of initially prescribed HEP in order to facilitate improved independence with management of symptoms.  Baseline: HEP provided on eval Goal status: ONGOING   2. Pt will score greater than or equal to 35/80 on LEFS in order to demonstrate improved perception of function due to symptoms.            Baseline: 25  08/27/22: 53            Goal status: MET              LONG TERM GOALS: Target date: 10/15/2022      Patient will be I with final HEP to maintain progress from PT. Baseline: HEP provided at eval Goal status: INITIAL   2.  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  Patient will demonstrate right extension strength 5/5 MMT in order to avoid knee giving way with walking or squatting tasks Baseline: deferred given acuity of surgery Goal status: INITIAL   4.  Patient will report pain level </= 3/10 with standing and walking greater than 1hr to improve ability to perform work related tasks Baseline: limited d/t pain/swelling, pain 0-9/10 Goal status: INITIAL   5. Pt will score > or equal to 50/80 on LEFS in order to indicate improved perception of function due to pain.             Baseline: 25/80            Goal status: INITIAL     PLAN: PT FREQUENCY: 2x/week   PT DURATION:  12 weeks    PLANNED INTERVENTIONS: Therapeutic exercises, Therapeutic activity, Neuromuscular re-education, Balance training, Gait training, Patient/Family education, Self Care, Joint mobilization, Stair training, DME instructions, Aquatic Therapy, Dry Needling, Electrical stimulation, Cryotherapy, Moist heat, scar mobilization, Taping, Vasopneumatic device, Manual therapy, and Re-evaluation   PLAN FOR NEXT SESSION:  ***  progress ROM/gait within precautions as able/tolerated. Emphasis on quad activation.     Leeroy Cha PT, DPT 09/09/2022 8:31 AM

## 2022-09-10 ENCOUNTER — Telehealth: Payer: Self-pay | Admitting: Physical Therapy

## 2022-09-10 ENCOUNTER — Ambulatory Visit: Payer: Medicaid Other | Admitting: Physical Therapy

## 2022-09-10 NOTE — Telephone Encounter (Signed)
Called pt re: this morning's missed appt - able to speak with pt, advised on attendance policy and confirmed date/time of next appt

## 2022-09-12 ENCOUNTER — Ambulatory Visit: Payer: Medicaid Other | Admitting: Physical Therapy

## 2022-09-12 DIAGNOSIS — M6281 Muscle weakness (generalized): Secondary | ICD-10-CM

## 2022-09-12 DIAGNOSIS — M25561 Pain in right knee: Secondary | ICD-10-CM

## 2022-09-12 DIAGNOSIS — R600 Localized edema: Secondary | ICD-10-CM

## 2022-09-12 DIAGNOSIS — R2689 Other abnormalities of gait and mobility: Secondary | ICD-10-CM

## 2022-09-12 NOTE — Therapy (Addendum)
OUTPATIENT PHYSICAL THERAPY TREATMENT NOTE + NO VISIT DISCHARGE (see below)   Patient Name: Tanner Figueroa MRN: [id number] DOB:1996/07/09, 27 y.o., male Today's Date: 09/12/2022  PCP: NA   REFERRING PROVIDER: Leandrew Koyanagi, MD  END OF SESSION:   PT End of Session - 09/12/22 1144     Visit Number 9    Number of Visits 17    Date for PT Re-Evaluation 09/17/22    Authorization Type [place] [facility]    Authorization Time Period 08/14/22-09/24/22    Authorization - Visit Number 4    Authorization - Number of Visits 12    PT Start Time [id number]    PT Stop Time 1230    PT Time Calculation (min) 47 min    Activity Tolerance Patient tolerated treatment well;No increased pain    Behavior During Therapy WFL for tasks assessed/performed                    Past Medical History:  Diagnosis Date   Eczema    OCD (obsessive compulsive disorder)    Patellar disorder    Past Surgical History:  Procedure Laterality Date   FOREIGN BODY REMOVAL Right 07/10/2022   Procedure: FOREIGN BODY REMOVAL ADULT;  Surgeon: Leandrew Koyanagi, MD;  Location: [place];  Service: Orthopedics;  Laterality: Right;   KNEE ARTHROSCOPY WITH LATERAL RELEASE Right 07/10/2022   Procedure: LATERAL RELEASE;  Surgeon: Leandrew Koyanagi, MD;  Location: [place];  Service: Orthopedics;  Laterality: Right;   KNEE ARTHROSCOPY WITH MEDIAL PATELLAR FEMORAL LIGAMENT RECONSTRUCTION Right 07/10/2022   Procedure: RIGHT KNEE ARTHROSCOPY WITH MEDIAL PATELLAR FEMORAL LIGAMENT RECONSTRUCTION;  Surgeon: Leandrew Koyanagi, MD;  Location: [place];  Service: Orthopedics;  Laterality: Right;   WISDOM TOOTH EXTRACTION     Patient Active Problem List   Diagnosis Date Noted   Acute lateral meniscus tear of right knee 07/10/2022   Traumatic avulsion of patellofemoral ligament, right, initial encounter 05/09/2022   Patellofemoral dysfunction of right knee 05/09/2022   Chondral loose body of right knee  joint 05/09/2022    REFERRING DIAG:  [id number] (ICD-10-CM) - Traumatic avulsion of patellofemoral ligament, right, initial encounter  M25.861 (ICD-10-CM) - Patellofemoral dysfunction of right knee  S83.281A (ICD-10-CM) - Acute lateral meniscus tear of right knee, initial encounter    THERAPY DIAG:  Acute pain of right knee  Muscle weakness (generalized)  Other abnormalities of gait and mobility  Localized edema  Rationale for Evaluation and Treatment Rehabilitation  PERTINENT HISTORY: s/p R knee arthroscopy w/ partial lateral meniscectomy, patellar chondroplasty, MPFL reconstruction, and lateral release  PRECAUTIONS: WBAT with brace per Op Note; ROM with PSO brace per 07/17/22 physician notes, no lateral patellar mobilizations   SUBJECTIVE:  SUBJECTIVE STATEMENT:   "The knee is fine, but I do think barometric pressure effects."   PAIN:  Are you having pain? 09/12/2022  NPRS scale: 3/10 at worst  Pain location: Right knee Pain description: Tight, pulling, sharp, aching Aggravating factors: Bending the knee, walking, sleeping on stomach, stairs Relieving factors: Rest, using the brace, medication   OBJECTIVE: (objective measures completed at initial evaluation unless otherwise dated)   DIAGNOSTIC FINDINGS:  Right knee MRI 03/25/2022: IMPRESSION: 1. Ruptured PCL. 2. Grade 1 sprains of the proximal fibular collateral ligament and distal popliteus tendon. 3. Contusions of the anterior medial tibial plateau and inferior patella. 4. 1.0 cm intra-articular body inferior to the posterior horn of the lateral meniscus, of unclear donor site. This appears separate from the meniscus. No meniscal tear. 5. Suspected sequelae of prior transient lateral patellar dislocation with chronic high-grade partial tear  of the MPFL at its patellar attachment and old impaction fracture of the superomedial patella. No marrow edema or surrounding soft tissue swelling to suggest acute injury. 6. Moderate joint effusion.   PATIENT SURVEYS:  LEFS 25/80 LEFS 08/27/22 53/80   COGNITION:           Overall cognitive status: Within functional limits for tasks assessed                          SENSATION: Grossly intact aside from subjective numbness + reduction in sensation medial/lateral/distal knee on R   EDEMA/OBSERVATION:  Circumferential: 47.5cm at patella R, 44cm on L 43cm at tibial tuberosity R, 42.5cm on L Incisions appear to be healing well, no overt erythema or drainage, appear WNL     PALPATION: Mild TTP lateral quad and TFL on R Gross tenderness throughout knee  Deferred patellar mobilizations given acuity of surgery   LOWER EXTREMITY ROM:   Active ROM Right eval Left eval R AROM 08/27/22  Knee flexion Seated, 85 deg 118 117 supine  Knee extension Lacking 4 deg 0 0  Comments: Pt self-initiates bending of knee during subjective, quickly measured while in position, deferred further focal assessment of knee flexion given recency of surgery and available ROM   LOWER EXTREMITY MMT:   MMT Right eval Left eval  Hip flexion      Hip extension      Hip abduction      Knee flexion      Knee extension      Comments: NT given recency of surgery     LOWER EXTREMITY SPECIAL TESTS:  Not assessed   FUNCTIONAL TESTS:  Sit to stand transfer from standard chair: B UE support, R knee extended relative to L, weight shift to L  08/27/22: 5xSTS standard chair 15sec w UE support   GAIT: Assistive device utilized: None Level of assistance: Complete Independence Comments: pt arrives without brace or AD; antalgic gait, reduced R knee ROM throughout all phases of gait, partial step through pattern      TODAY'S TREATMENT: [facility] Adult PT Treatment:                                                DATE:  09/12/2022 Therapeutic Exercise: Recumbent L5 x 5 min Standing calf stretch 2 x 30 sec on slant board Standing hamstring/ quad stretch 2 x 30 sec Leg press 3 x 15 40# con bil/ ecc  RLE (ROM monitoring for 9 weeks post-op monitoring) Bil LE LAQ 1 x 15,1 x 10 20#  Bil LE Hamstring curl 2 x 15 35# bil LE Neuromuscular re-ed: SLS 5 x  static hold best attempt held for 8 seconds before touching SLS ABCs with physioball- unable to peform, modified to progression from Rhomboid to tandem        [facility] Adult PT Treatment:                                                DATE: 08/29/22 Therapeutic Exercise: Bike 88min during subjective  Mini squat x3 (discontinued due to L knee pain) SLR 3x15 RLE only cues for pacing and full extension SL hip abduction RLE 2x10 cues for form and hip position  4inch lateral step up 2x10 RLE only cues for form HEP update + handout  Therapeutic Activity: 4inch fwd step up 2x10 RLE only cues for form and knee control on descent Discussion re: activity modification, pacing of activities, monitoring L knee symptoms and discussing with provider as needed    [facility] Adult PT Treatment:                                                DATE: 08/27/22 Therapeutic Exercise: Recumbent bike 20min during subjective SLR 2x15 cues for form and control Mini squat 2x10 cues for appropriate knee mechanics, no UE support 2inch lateral step up 2x12 cues for control 4 inch fwd step up 2x8 cues for form and control  Sidelying hip abduction 2x8 RLE only cues for form and control   Therapeutic Activity: LEFS + education 5xSTS + education MSK assessment + education    PATIENT EDUCATION:  Education details: rationale for interventions, relevant anatomy/physiology, HEP Person educated: Patient Education method: Explanation, Demonstration, Tactile cues, Verbal cues, and Handouts Education comprehension: verbalized understanding, returned demonstration, verbal cues required, tactile cues  required, and needs further education   HOME EXERCISE PROGRAM: Access Code: [email or web address] URL: [email or web address] Date: 09/12/2022 Prepared by: Starr Lake  Exercises - Standing Knee Flexion AROM with Chair Support  - 1 x daily - 7 x weekly - 2 sets - 10 reps - Heel Raises with Counter Support  - 1 x daily - 7 x weekly - 2 sets - 15 reps - SLR (Mirrored)  - 1 x daily - 7 x weekly - 2 sets - 155 reps - 1 hold - Mini Squat with Counter Support  - 1 x daily - 7 x weekly - 2 sets - 10 reps - Sidelying Hip Abduction  - 1 x daily - 7 x weekly - 2 sets - 10 reps - Standing Bilateral Heel Raise on Step  - 1 x daily - 7 x weekly - 2 sets - 20 reps   ASSESSMENT: CLINICAL IMPRESSION: Pt arrives to PT today reporting 3/10 pain attributing it to the weather. Pt is 9 weeks post op yesterday and continued progressing strengthening with emphasis on endurance with controlled weight. He did well with all exercises today with no report of pain or discomfort. Plan to schedule another appt with his evaluating PT for reassessment and updating POC / MCD auth.    OBJECTIVE IMPAIRMENTS Abnormal gait, decreased activity tolerance, decreased balance, decreased  mobility, difficulty walking, decreased ROM, decreased strength, increased edema, impaired flexibility, impaired sensation, improper body mechanics, and pain.    ACTIVITY LIMITATIONS carrying, lifting, bending, sitting, standing, squatting, sleeping, stairs, transfers, and locomotion level   PARTICIPATION LIMITATIONS: meal prep, cleaning, laundry, shopping, community activity, occupation, and yard work   PERSONAL FACTORS Past/current experiences, Profession, and Time since onset of injury/illness/exacerbation are also affecting patient's functional outcome.    REHAB POTENTIAL: Good   CLINICAL DECISION MAKING: Stable/uncomplicated   EVALUATION COMPLEXITY: Low     GOALS: Goals reviewed with patient? Yes SHORT TERM GOALS: Target date:  09/03/2022   1. Pt will demonstrate appropriate understanding and performance of initially prescribed HEP in order to facilitate improved independence with management of symptoms.  Baseline: HEP provided on eval Goal status: ONGOING   2. Pt will score greater than or equal to 35/80 on LEFS in order to demonstrate improved perception of function due to symptoms.            Baseline: 25  08/27/22: 53            Goal status: MET              LONG TERM GOALS: Target date: 10/15/2022     Patient will be I with final HEP to maintain progress from PT. Baseline: HEP provided at eval Goal status: INITIAL   2.  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  Patient will demonstrate right extension strength 5/5 MMT in order to avoid knee giving way with walking or squatting tasks Baseline: deferred given acuity of surgery Goal status: INITIAL   4.  Patient will report pain level </= 3/10 with standing and walking greater than 1hr to improve ability to perform work related tasks Baseline: limited d/t pain/swelling, pain 0-9/10 Goal status: INITIAL   5. Pt will score > or equal to 50/80 on LEFS in order to indicate improved perception of function due to pain.             Baseline: 25/80            Goal status: INITIAL     PLAN: PT FREQUENCY: 2x/week   PT DURATION:  12 weeks    PLANNED INTERVENTIONS: Therapeutic exercises, Therapeutic activity, Neuromuscular re-education, Balance training, Gait training, Patient/Family education, Self Care, Joint mobilization, Stair training, DME instructions, Aquatic Therapy, Dry Needling, Electrical stimulation, Cryotherapy, Moist heat, scar mobilization, Taping, Vasopneumatic device, Manual therapy, and Re-evaluation   PLAN FOR NEXT SESSION:   progress ROM/gait within precautions as able/tolerated. Emphasis on quad  activation. May need to resubmit to MCD.    Coreena Rubalcava PT, DPT, LAT, ATC  09/12/22  12:38 PM  Discharge addendum:   PHYSICAL THERAPY DISCHARGE SUMMARY  Visits from Start of Care: 9  Current functional level related to goals / functional outcomes: Unable to assess, not returned to clinic   Remaining deficits: Unable to assess   Education / Equipment: Unable to assess  Patient unable to agree to discharge due to lack of follow up.  Patient goals were  unable to be assessed . Patient is being discharged due to not returning since the last visit.   Leeroy Cha PT, DPT 10/31/2022 2:59 PM

## 2022-09-25 ENCOUNTER — Ambulatory Visit: Payer: Medicaid Other | Admitting: Physical Therapy
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
## 2022-10-05 ENCOUNTER — Ambulatory Visit: Payer: Medicaid Other | Admitting: Physical Therapy

## 2024-07-21 DIAGNOSIS — Y249XXA Unspecified firearm discharge, undetermined intent, initial encounter: Secondary | ICD-10-CM

## 2024-07-21 DIAGNOSIS — S0180XA Unspecified open wound of other part of head, initial encounter: Secondary | ICD-10-CM | POA: Insufficient documentation

## 2024-07-21 DIAGNOSIS — W3400XA Accidental discharge from unspecified firearms or gun, initial encounter: Secondary | ICD-10-CM | POA: Insufficient documentation

## 2024-07-21 LAB — I-STAT CHEM 8, ED
BUN: 13 mg/dL (ref 6–20)
Calcium, Ion: 1.04 mmol/L — ABNORMAL LOW (ref 1.15–1.40)
Chloride: 106 mmol/L (ref 98–111)
Creatinine, Ser: 1.7 mg/dL — ABNORMAL HIGH (ref 0.61–1.24)
Glucose, Bld: 275 mg/dL — ABNORMAL HIGH (ref 70–99)
HCT: 24 % — ABNORMAL LOW (ref 39.0–52.0)
Hemoglobin: 8.2 g/dL — ABNORMAL LOW (ref 13.0–17.0)
Potassium: 3.2 mmol/L — ABNORMAL LOW (ref 3.5–5.1)
Sodium: 139 mmol/L (ref 135–145)
TCO2: 14 mmol/L — ABNORMAL LOW (ref 22–32)

## 2024-07-21 LAB — CBC
HCT: 25.7 % — ABNORMAL LOW (ref 39.0–52.0)
Hemoglobin: 8.3 g/dL — ABNORMAL LOW (ref 13.0–17.0)
MCH: 32.2 pg (ref 26.0–34.0)
MCHC: 32.3 g/dL (ref 30.0–36.0)
MCV: 99.6 fL (ref 80.0–100.0)
Platelets: 136 K/uL — ABNORMAL LOW (ref 150–400)
RBC: 2.58 MIL/uL — ABNORMAL LOW (ref 4.22–5.81)
RDW: 14.6 % (ref 11.5–15.5)
WBC: 12.1 K/uL — ABNORMAL HIGH (ref 4.0–10.5)
nRBC: 0 % (ref 0.0–0.2)

## 2024-07-21 LAB — COMPREHENSIVE METABOLIC PANEL WITH GFR
ALT: 17 U/L (ref 0–44)
AST: 59 U/L — ABNORMAL HIGH (ref 15–41)
Albumin: 3.1 g/dL — ABNORMAL LOW (ref 3.5–5.0)
Alkaline Phosphatase: 39 U/L (ref 38–126)
Anion gap: 22 — ABNORMAL HIGH (ref 5–15)
BUN: 13 mg/dL (ref 6–20)
CO2: 11 mmol/L — ABNORMAL LOW (ref 22–32)
Calcium: 7.5 mg/dL — ABNORMAL LOW (ref 8.9–10.3)
Chloride: 104 mmol/L (ref 98–111)
Creatinine, Ser: 1.5 mg/dL — ABNORMAL HIGH (ref 0.61–1.24)
GFR, Estimated: >60 mL/min
Glucose, Bld: 293 mg/dL — ABNORMAL HIGH (ref 70–99)
Potassium: 3.2 mmol/L — ABNORMAL LOW (ref 3.5–5.1)
Sodium: 137 mmol/L (ref 135–145)
Total Bilirubin: <0.2 mg/dL (ref 0.0–1.2)
Total Protein: 4.6 g/dL — ABNORMAL LOW (ref 6.5–8.1)

## 2024-07-21 LAB — ETHANOL: Alcohol, Ethyl (B): 72 mg/dL — ABNORMAL HIGH

## 2024-07-21 LAB — PROTIME-INR
INR: 2.3 — ABNORMAL HIGH (ref 0.8–1.2)
Prothrombin Time: 26.6 s — ABNORMAL HIGH (ref 11.4–15.2)

## 2024-07-21 LAB — I-STAT CG4 LACTIC ACID, ED: Lactic Acid, Venous: 8.5 mmol/L (ref 0.5–1.9)

## 2024-07-21 LAB — SAMPLE TO BLOOD BANK

## 2024-07-21 MED ADMIN — Fentanyl Citrate Preservative Free (PF) Inj 100 MCG/2ML: 100 ug | INTRAVENOUS | NDC 72572017025

## 2024-07-21 MED ADMIN — Succinylcholine Chloride Inj 20 MG/ML: 100 mg | INTRAVENOUS | NDC 71288071911

## 2024-07-21 MED ADMIN — Etomidate IV Soln 2 MG/ML: 20 mg | INTRAVENOUS | NDC 55150022110

## 2024-07-23 ENCOUNTER — Telehealth: Payer: Self-pay

## 2024-07-23 NOTE — Telephone Encounter (Signed)
 Received vm from patient's mother, not listed in demographics on file, regarding the passing of her son. CM leadership made aware.  Merilee Batty, MSN, RN Case Management [phone]

## 2024-07-29 NOTE — ED Notes (Signed)
 Patient time of death occurred at 16.
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
## 2024-07-29 NOTE — ED Notes (Signed)
 Wife at bedside.
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
## 2024-07-29 NOTE — Progress Notes (Signed)
 Orthopedic Tech Progress Note Patient Details:  Tanner Figueroa 07/29/1876 [id number]  Patient ID: Tanner Figueroa, male   DOB: 07/29/1876, 29 y.o.   MRN: [id number] Responded to Level 1 Trauma ortho tech not needed. Thersia F Verlinda Slotnick 2024-08-20, 2:08 AM

## 2024-07-29 NOTE — ED Provider Notes (Signed)
 Procedure Name: Intubation Date/Time: 08-03-24 2:46 AM  Performed by: Beverley Leita LABOR, PA-CPre-anesthesia Checklist: Patient identified, Patient being monitored, Emergency Drugs available, Timeout performed and Suction available Oxygen Delivery Method: Non-rebreather mask Preoxygenation: Pre-oxygenation with 100% oxygen Induction Type: Rapid sequence Ventilation: Mask ventilation without difficulty Laryngoscope Size: Glidescope Number of attempts: 1 Airway Equipment and Method: Video-laryngoscopy Placement Confirmation: ETT inserted through vocal cords under direct vision, CO2 detector and Breath sounds checked- equal and bilateral Tube secured with: ETT holder Dental Injury: Teeth and Oropharynx as per pre-operative assessment         Beverley Leita LABOR, PA-C 08/03/24 0247    Haze Lonni PARAS, MD 2024/08/03 [phone]
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
## 2024-07-29 DEATH — deceased
